# Patient Record
Sex: Female | Born: 1970 | Race: Black or African American | Hispanic: No | Marital: Married | Smoking: Former smoker
Health system: Southern US, Community
[De-identification: ages and names within clinical notes are randomized; demographics above are authoritative.]

## PROBLEM LIST (undated history)

## (undated) DIAGNOSIS — R609 Edema, unspecified: Secondary | ICD-10-CM

## (undated) DIAGNOSIS — I10 Essential (primary) hypertension: Secondary | ICD-10-CM

## (undated) DIAGNOSIS — J40 Bronchitis, not specified as acute or chronic: Secondary | ICD-10-CM

## (undated) DIAGNOSIS — J45909 Unspecified asthma, uncomplicated: Secondary | ICD-10-CM

## (undated) HISTORY — PX: ABDOMINAL HYSTERECTOMY: SHX81

## (undated) HISTORY — PX: OTHER SURGICAL HISTORY: SHX169

## (undated) HISTORY — DX: Bronchitis, not specified as acute or chronic: J40

## (undated) HISTORY — DX: Essential (primary) hypertension: I10

---

## 2004-02-12 ENCOUNTER — Emergency Department: Payer: Self-pay | Admitting: Emergency Medicine

## 2004-10-27 ENCOUNTER — Emergency Department: Payer: Self-pay | Admitting: Emergency Medicine

## 2005-04-02 ENCOUNTER — Emergency Department: Payer: Self-pay | Admitting: Emergency Medicine

## 2006-09-05 ENCOUNTER — Emergency Department: Payer: Self-pay

## 2007-06-21 ENCOUNTER — Emergency Department: Payer: Self-pay | Admitting: Emergency Medicine

## 2007-11-01 ENCOUNTER — Emergency Department: Payer: Self-pay | Admitting: Emergency Medicine

## 2007-12-31 ENCOUNTER — Emergency Department: Payer: Self-pay | Admitting: Internal Medicine

## 2008-01-02 ENCOUNTER — Ambulatory Visit: Payer: Self-pay

## 2008-07-26 ENCOUNTER — Emergency Department: Payer: Self-pay | Admitting: Emergency Medicine

## 2008-09-30 ENCOUNTER — Emergency Department: Payer: Self-pay | Admitting: Emergency Medicine

## 2008-10-09 ENCOUNTER — Emergency Department: Payer: Self-pay | Admitting: Emergency Medicine

## 2008-12-16 ENCOUNTER — Emergency Department: Payer: Self-pay | Admitting: Unknown Physician Specialty

## 2008-12-23 ENCOUNTER — Emergency Department: Payer: Self-pay | Admitting: Emergency Medicine

## 2009-03-28 ENCOUNTER — Observation Stay: Payer: Self-pay | Admitting: Internal Medicine

## 2009-04-29 ENCOUNTER — Ambulatory Visit: Payer: Self-pay | Admitting: Internal Medicine

## 2009-10-27 ENCOUNTER — Emergency Department: Payer: Self-pay | Admitting: Unknown Physician Specialty

## 2009-11-02 ENCOUNTER — Emergency Department: Payer: Self-pay | Admitting: Emergency Medicine

## 2010-04-04 ENCOUNTER — Emergency Department: Payer: Self-pay | Admitting: Emergency Medicine

## 2010-08-22 ENCOUNTER — Emergency Department: Payer: Self-pay | Admitting: Unknown Physician Specialty

## 2011-04-19 ENCOUNTER — Emergency Department: Payer: Self-pay | Admitting: Emergency Medicine

## 2011-04-19 LAB — CBC
HCT: 40 % (ref 35.0–47.0)
HGB: 13.2 g/dL (ref 12.0–16.0)
MCH: 31.6 pg (ref 26.0–34.0)
MCHC: 32.9 g/dL (ref 32.0–36.0)
MCV: 96 fL (ref 80–100)
RDW: 14.6 % — ABNORMAL HIGH (ref 11.5–14.5)

## 2011-04-19 LAB — COMPREHENSIVE METABOLIC PANEL
BUN: 11 mg/dL (ref 7–18)
Chloride: 103 mmol/L (ref 98–107)
EGFR (African American): >60
EGFR (Non-African Amer.): >60
Osmolality: 278 (ref 275–301)
SGOT(AST): 21 U/L (ref 15–37)
SGPT (ALT): 21 U/L
Sodium: 140 mmol/L (ref 136–145)
Total Protein: 7.5 g/dL (ref 6.4–8.2)

## 2011-10-30 ENCOUNTER — Emergency Department: Payer: Self-pay | Admitting: Emergency Medicine

## 2012-07-21 IMAGING — CR DG CHEST 2V
1 series · 2 of 2 positions shown · non-contrast
Comparison: none

REASON FOR EXAM: chest pain
COMMENTS:   LMP: N/A

PROCEDURE:     DXR - DXR CHEST PA (OR AP) AND LATERAL  - April 19, 2011 [DATE]
RESULT:     Comparison: None

[Series 1: w chest pa · 0.14mm/px · 2 of 2 slices shown]
[im 1/2]
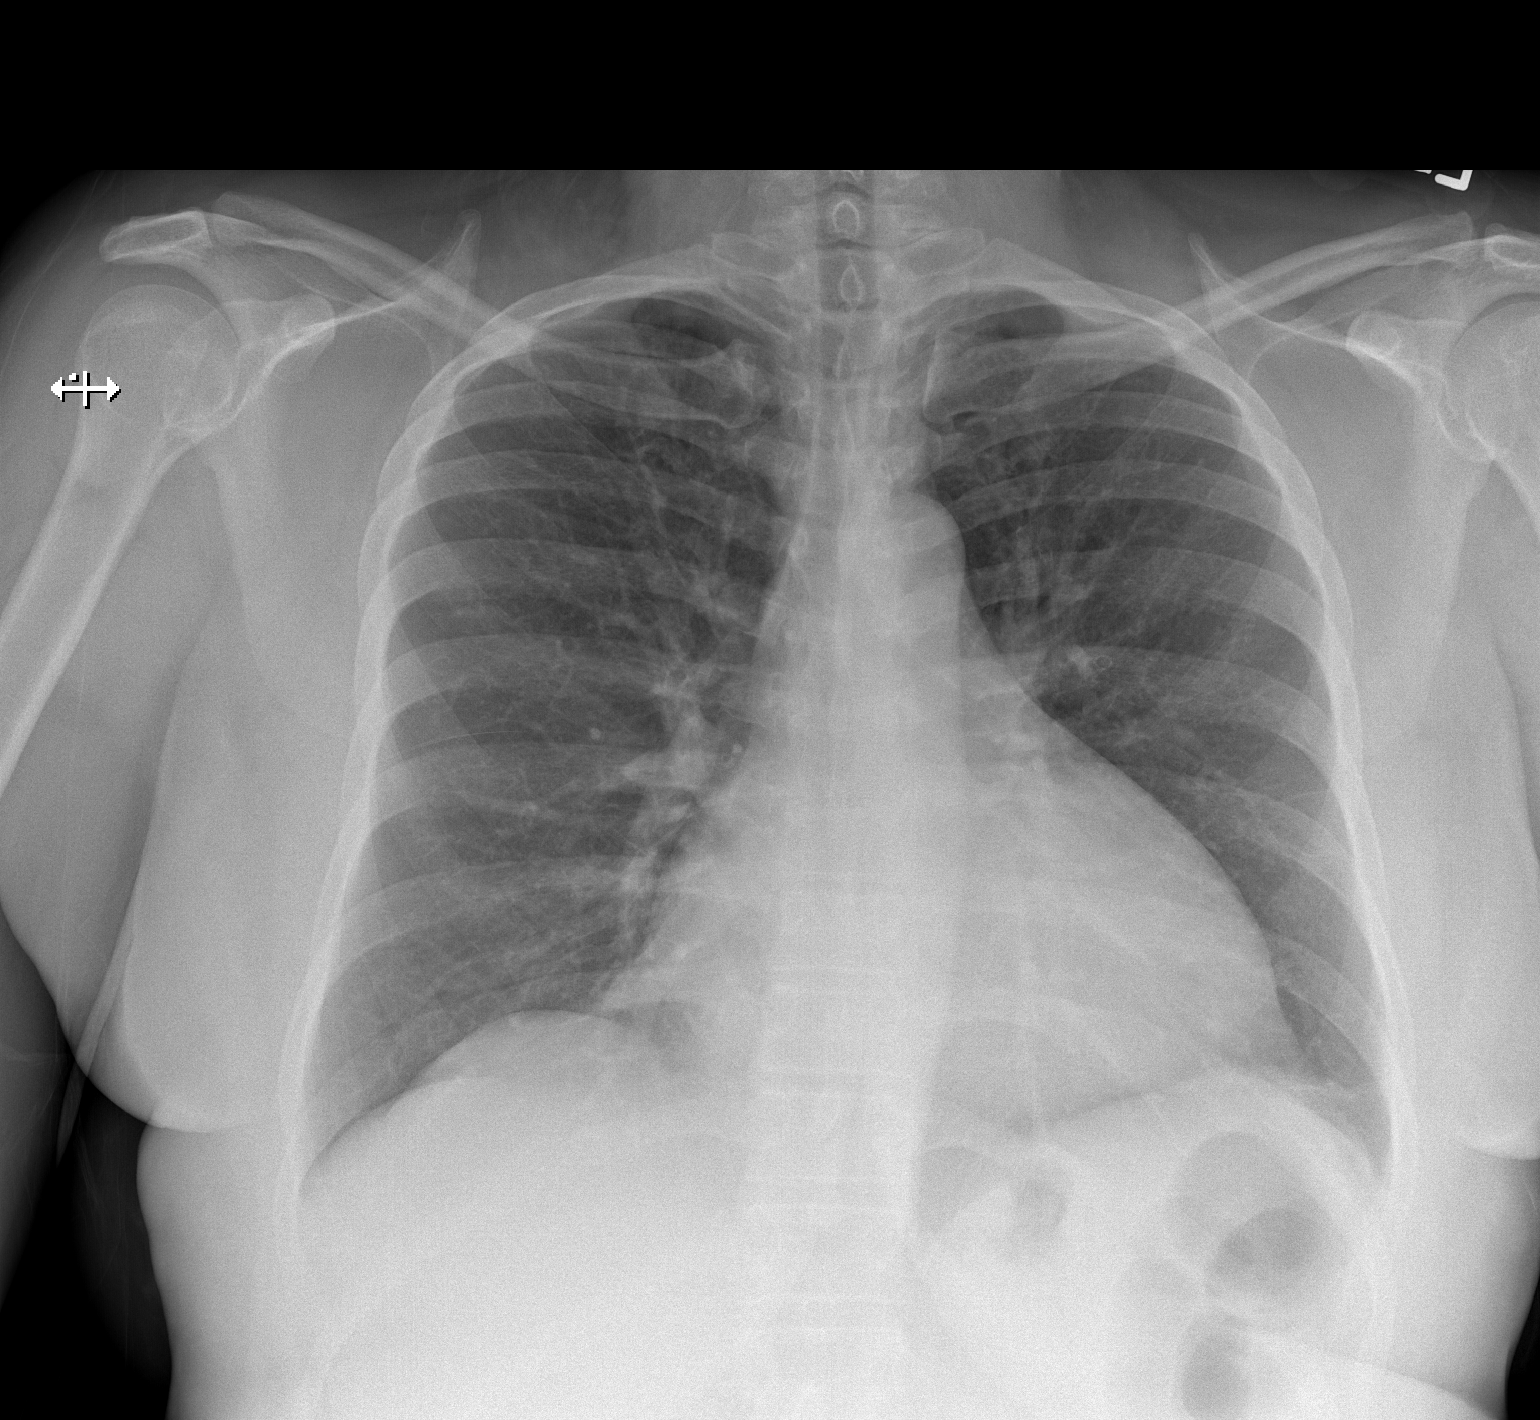
[im 2/2]
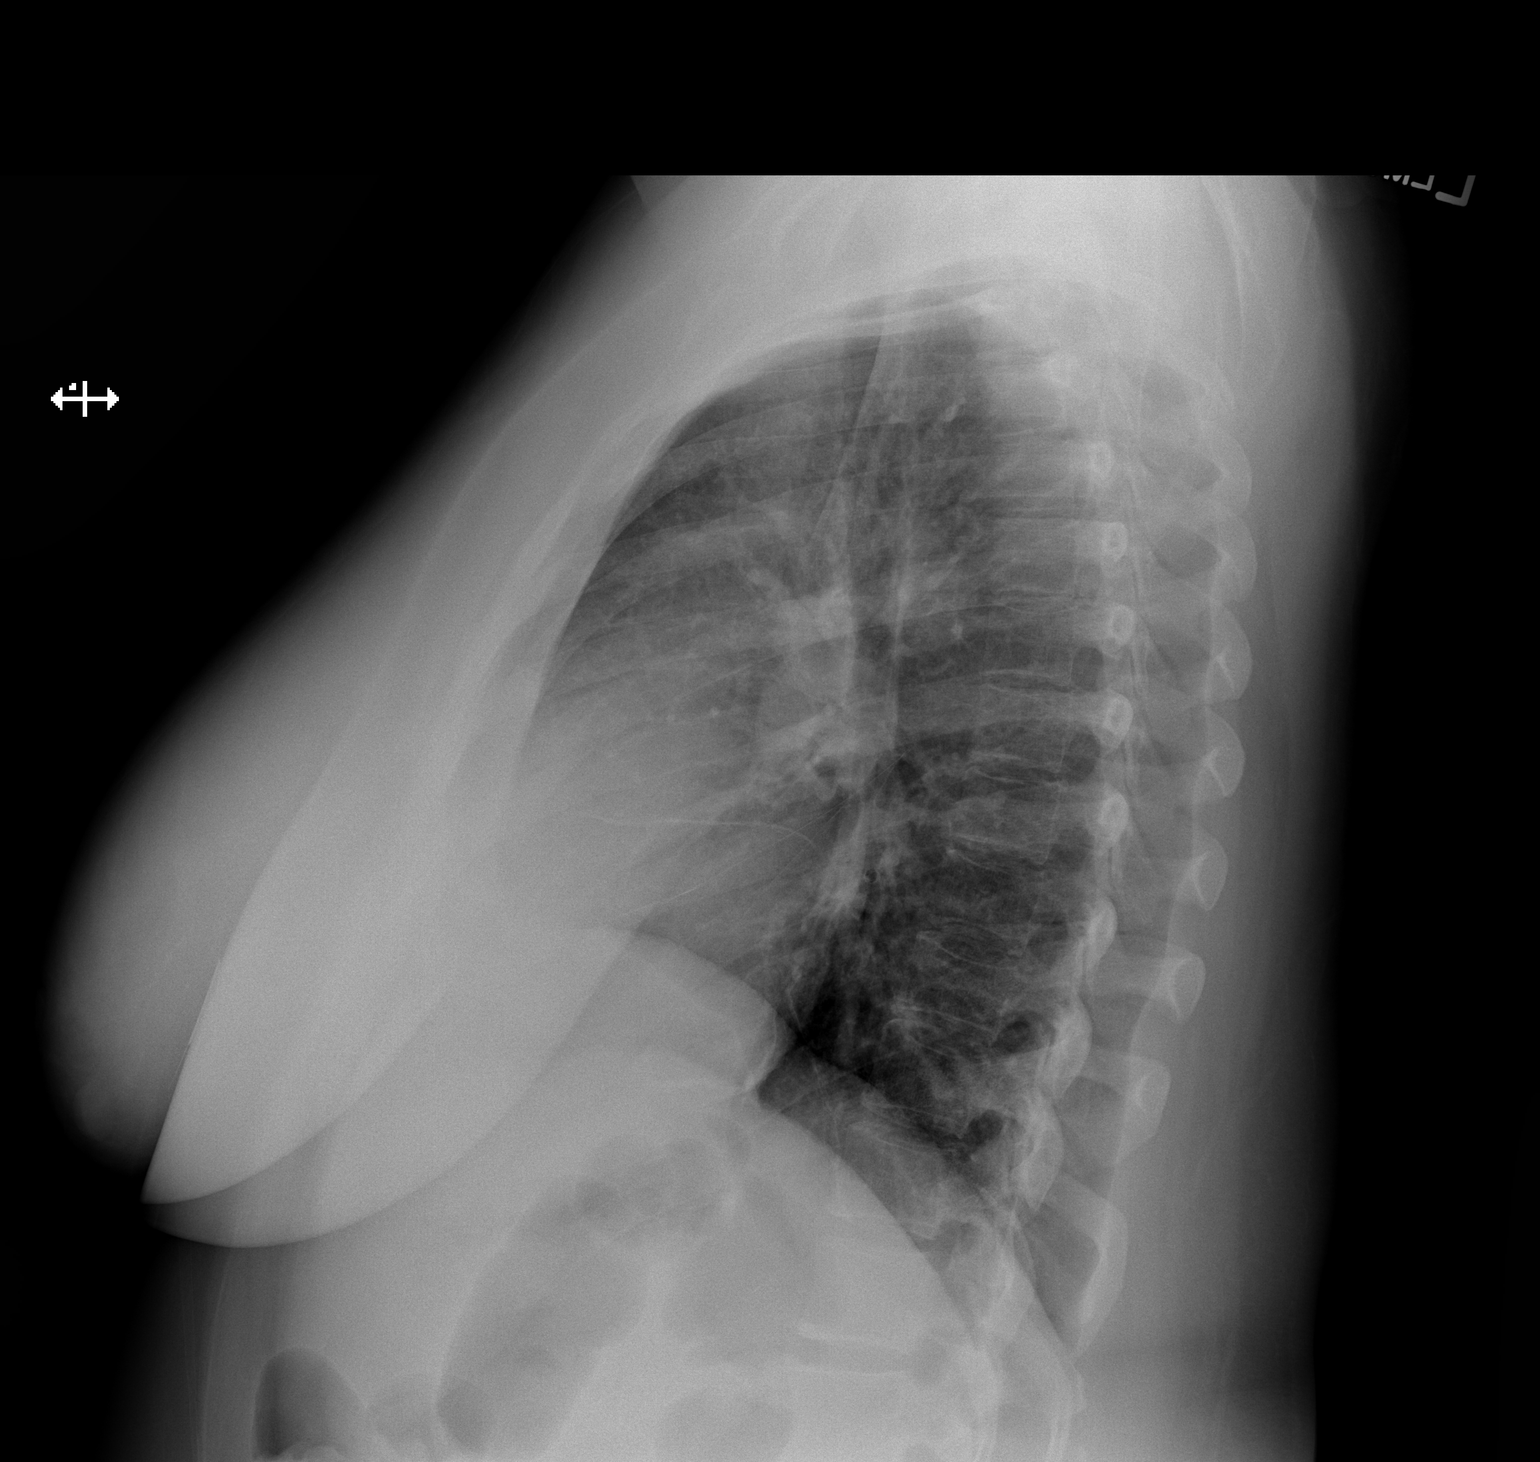

[2 of 2 positions shown; findings below may reference images not displayed]

FINDINGS: PA and lateral chest radiographs are provided.  There is no focal
parenchymal opacity, pleural effusion, or pneumothorax. The heart and
mediastinum are unremarkable.  The osseous structures are unremarkable.
IMPRESSION: No acute disease of the che[REDACTED]

## 2012-09-29 ENCOUNTER — Emergency Department: Payer: Self-pay | Admitting: Emergency Medicine

## 2013-04-16 ENCOUNTER — Other Ambulatory Visit (HOSPITAL_COMMUNITY): Payer: Self-pay | Admitting: Neurosurgery

## 2013-04-16 DIAGNOSIS — M48061 Spinal stenosis, lumbar region without neurogenic claudication: Secondary | ICD-10-CM

## 2013-04-16 DIAGNOSIS — IMO0002 Reserved for concepts with insufficient information to code with codable children: Secondary | ICD-10-CM

## 2013-04-16 DIAGNOSIS — M5126 Other intervertebral disc displacement, lumbar region: Secondary | ICD-10-CM

## 2013-04-17 ENCOUNTER — Other Ambulatory Visit: Payer: Self-pay | Admitting: Radiology

## 2013-04-24 ENCOUNTER — Ambulatory Visit (HOSPITAL_COMMUNITY)
Admission: RE | Admit: 2013-04-24 | Discharge: 2013-04-24 | Disposition: A | Discharge status: Home / Self Care | Payer: Worker's Compensation | Source: Ambulatory Visit | Attending: Neurosurgery | Admitting: Neurosurgery

## 2013-04-24 ENCOUNTER — Other Ambulatory Visit (HOSPITAL_COMMUNITY): Payer: Self-pay | Admitting: Neurosurgery

## 2013-04-24 ENCOUNTER — Ambulatory Visit (HOSPITAL_COMMUNITY)
Admission: RE | Admit: 2013-04-24 | Discharge: 2013-04-24 | Disposition: A | Discharge status: Home / Self Care | Payer: Self-pay | Source: Ambulatory Visit | Attending: Neurosurgery | Admitting: Neurosurgery

## 2013-04-24 DIAGNOSIS — M5126 Other intervertebral disc displacement, lumbar region: Secondary | ICD-10-CM

## 2013-04-24 DIAGNOSIS — IMO0002 Reserved for concepts with insufficient information to code with codable children: Secondary | ICD-10-CM

## 2013-04-24 DIAGNOSIS — M545 Low back pain, unspecified: Secondary | ICD-10-CM | POA: Insufficient documentation

## 2013-04-24 DIAGNOSIS — M5030 Other cervical disc degeneration, unspecified cervical region: Secondary | ICD-10-CM | POA: Insufficient documentation

## 2013-04-24 DIAGNOSIS — M5137 Other intervertebral disc degeneration, lumbosacral region: Secondary | ICD-10-CM | POA: Insufficient documentation

## 2013-04-24 DIAGNOSIS — M48061 Spinal stenosis, lumbar region without neurogenic claudication: Secondary | ICD-10-CM

## 2013-04-24 DIAGNOSIS — M51379 Other intervertebral disc degeneration, lumbosacral region without mention of lumbar back pain or lower extremity pain: Secondary | ICD-10-CM | POA: Insufficient documentation

## 2013-04-24 MED ORDER — IOHEXOL 180 MG/ML  SOLN
20.0000 mL | Freq: Once | INTRAMUSCULAR | Status: AC | PRN
  Administered 2013-04-24: 20 mL via INTRATHECAL

## 2013-04-24 MED ORDER — OXYCODONE-ACETAMINOPHEN 5-325 MG PO TABS
ORAL_TABLET | ORAL | Status: AC
  Filled 2013-04-24: qty 2

## 2013-04-24 MED ORDER — OXYCODONE-ACETAMINOPHEN 5-325 MG PO TABS
2.0000 | ORAL_TABLET | Freq: Four times a day (QID) | ORAL | Status: AC | PRN
  Administered 2013-04-24: 2 via ORAL

## 2013-04-24 MED ORDER — DIAZEPAM 5 MG PO TABS
10.0000 mg | ORAL_TABLET | Freq: Once | ORAL | Status: AC
  Administered 2013-04-24: 10 mg via ORAL
  Filled 2013-04-24: qty 2

## 2013-04-24 NOTE — Progress Notes (Signed)
Went over discharge instructions with pt. She verbalized understanding. Pt. Taken to car via w/c. Pt. Had no c/o. Female adult going to drive pt. Home.

## 2013-04-24 NOTE — Discharge Instructions (Signed)
Myelography Myelography is an X-ray test that uses a special dye to look at your spine or neck. This test is usually done to look for:  Spinal cord injury.  Disk ruptures.  Fluid-filled pockets of tissue (cysts) on your spinal cord or nerve roots.  Tumors on your spinal cord or nerve roots. BEFORE THE PROCEDURE Arrange for someone to drive you home after the test.  PROCEDURE  You will lie on your stomach during the procedure.  Medicine may be given to you to help you relax.  A numbing medicine will be applied to area that they will be injecting you with a needle.  A needle will be inserted between two of your back bones.  A special machine will be used to help your doctor guide the needle into the sac that surrounds your spinal cord and nerves. A special dye will be injected into this sac.  The table you lie on may be moved around to make sure the dye moves all around your spinal cord and nerves.  Pictures the area will be taken by X-ray or CT. AFTER THE PROCEDURE  You will be taken to a recovery area.  You will lie flat with your head in a raised position. This is to prevent a severe headache. Document Released: 10/27/2007 Document Revised: 01/04/2012 Document Reviewed: 10/12/2011 [employer] Patient Information 2014 [employer].  

## 2013-04-24 NOTE — Progress Notes (Signed)
Pt requested that I ask Dr Karin GoldenShogry if her discharge could be at 1330 instead of 1400, due to transportation needs. Dr Karin GoldenShogry gave permission for pt to do so. Michele RockersBelinda Tillman, RN discharged pt.

## 2013-05-31 HISTORY — PX: BACK SURGERY: SHX140

## 2014-02-27 ENCOUNTER — Emergency Department: Payer: Self-pay | Admitting: Emergency Medicine

## 2014-03-02 ENCOUNTER — Inpatient Hospital Stay: Payer: Self-pay | Admitting: Internal Medicine

## 2014-03-02 DIAGNOSIS — J209 Acute bronchitis, unspecified: Secondary | ICD-10-CM

## 2014-03-02 LAB — BASIC METABOLIC PANEL
Anion Gap: 12 (ref 7–16)
BUN: 14 mg/dL (ref 7–18)
CALCIUM: 8.7 mg/dL (ref 8.5–10.1)
Chloride: 104 mmol/L (ref 98–107)
Co2: 21 mmol/L (ref 21–32)
Creatinine: 1.13 mg/dL (ref 0.60–1.30)
EGFR (Non-African Amer.): 55 — ABNORMAL LOW
Glucose: 117 mg/dL — ABNORMAL HIGH (ref 65–99)
Osmolality: 275 (ref 275–301)
Potassium: 3.2 mmol/L — ABNORMAL LOW (ref 3.5–5.1)
SODIUM: 137 mmol/L (ref 136–145)

## 2014-03-02 LAB — CBC
HCT: 40.5 % (ref 35.0–47.0)
HGB: 13.3 g/dL (ref 12.0–16.0)
MCH: 31.1 pg (ref 26.0–34.0)
MCHC: 32.8 g/dL (ref 32.0–36.0)
MCV: 95 fL (ref 80–100)
PLATELETS: 216 10*3/uL (ref 150–440)
RBC: 4.27 10*6/uL (ref 3.80–5.20)
RDW: 13.7 % (ref 11.5–14.5)
WBC: 14 10*3/uL — ABNORMAL HIGH (ref 3.6–11.0)

## 2014-03-02 LAB — TROPONIN I: TROPONIN-I: 0.04 ng/mL

## 2014-03-03 DIAGNOSIS — J209 Acute bronchitis, unspecified: Secondary | ICD-10-CM

## 2014-03-03 LAB — BASIC METABOLIC PANEL
Anion Gap: 8 (ref 7–16)
BUN: 14 mg/dL (ref 7–18)
CHLORIDE: 105 mmol/L (ref 98–107)
CO2: 24 mmol/L (ref 21–32)
Calcium, Total: 8.1 mg/dL — ABNORMAL LOW (ref 8.5–10.1)
Creatinine: 0.91 mg/dL (ref 0.60–1.30)
EGFR (African American): >60
EGFR (Non-African Amer.): >60
Glucose: 144 mg/dL — ABNORMAL HIGH (ref 65–99)
Osmolality: 277 (ref 275–301)
Potassium: 4.4 mmol/L (ref 3.5–5.1)
SODIUM: 137 mmol/L (ref 136–145)

## 2014-03-03 LAB — CBC WITH DIFFERENTIAL/PLATELET
BASOS ABS: 0.1 10*3/uL (ref 0.0–0.1)
Basophil %: 0.5 %
Eosinophil #: 0 10*3/uL (ref 0.0–0.7)
Eosinophil %: 0 %
HCT: 38.4 % (ref 35.0–47.0)
HGB: 12.1 g/dL (ref 12.0–16.0)
LYMPHS ABS: 0.8 10*3/uL — AB (ref 1.0–3.6)
LYMPHS PCT: 5.9 %
MCH: 30.5 pg (ref 26.0–34.0)
MCHC: 31.6 g/dL — AB (ref 32.0–36.0)
MCV: 97 fL (ref 80–100)
MONOS PCT: 3.3 %
Monocyte #: 0.5 x10 3/mm (ref 0.2–0.9)
Neutrophil #: 12.9 10*3/uL — ABNORMAL HIGH (ref 1.4–6.5)
Neutrophil %: 90.3 %
Platelet: 223 10*3/uL (ref 150–440)
RBC: 3.98 10*6/uL (ref 3.80–5.20)
RDW: 14.1 % (ref 11.5–14.5)
WBC: 14.3 10*3/uL — AB (ref 3.6–11.0)

## 2014-03-13 ENCOUNTER — Telehealth: Payer: Self-pay | Admitting: Internal Medicine

## 2014-03-13 ENCOUNTER — Emergency Department: Payer: Self-pay | Admitting: Emergency Medicine

## 2014-03-13 MED ORDER — AMOXICILLIN-POT CLAVULANATE 875-125 MG PO TABS: 1.0000 | ORAL_TABLET | Freq: Two times a day (BID) | ORAL | Status: DC

## 2014-03-13 MED ORDER — PHENYLEPH-PROMETHAZINE-COD 5-6.25-10 MG/5ML PO SYRP: 5.0000 mL | ORAL_SOLUTION | Freq: Four times a day (QID) | ORAL | Status: DC | PRN

## 2014-03-13 NOTE — Telephone Encounter (Addendum)
D/C summary received and will give to Dr Maple HudsonYoung (doc of the day) to review and advise  Called and verified pt has NKDA

## 2014-03-13 NOTE — Telephone Encounter (Signed)
Per CDY- call in augmentin 875 mg 1 bid # 14 and phenergan with codeine syrup # 200 ml 1 tsp every 6 hours prn  Pt aware of the above and ok with this plan  I have called in these meds and also refilled her albuterol inhaler to use PRN per her request

## 2014-03-13 NOTE — Telephone Encounter (Signed)
Spoke with the pt  She states that she was [facility]  She thought she had HFU with VM today, but the appt is not until 03/20/14  Pt states that she has finished her ABX and "steroids" as of 03/12/14  She c/o still having a cough- prod with moderate brown sputum  She feels that her SOB is not improving since d/c and has been having tightness in her chest for the past 2 days  I advised will send msg to doc on call since Dr Dema SeverinMungal is off this pm  [facility] fax over the d/c summary and consult note from pulm

## 2014-03-20 ENCOUNTER — Ambulatory Visit (INDEPENDENT_AMBULATORY_CARE_PROVIDER_SITE_OTHER): Payer: Self-pay | Admitting: Internal Medicine

## 2014-03-20 ENCOUNTER — Encounter: Payer: Self-pay | Admitting: Internal Medicine

## 2014-03-20 ENCOUNTER — Inpatient Hospital Stay: Payer: Self-pay | Admitting: Internal Medicine

## 2014-03-20 ENCOUNTER — Ambulatory Visit: Payer: Self-pay | Admitting: Internal Medicine

## 2014-03-20 VITALS — BP 148/70 | HR 62 | Temp 97.9°F | Ht 65.0 in | Wt 251.4 lb

## 2014-03-20 DIAGNOSIS — R059 Cough, unspecified: Secondary | ICD-10-CM | POA: Insufficient documentation

## 2014-03-20 DIAGNOSIS — R05 Cough: Secondary | ICD-10-CM

## 2014-03-20 DIAGNOSIS — R0600 Dyspnea, unspecified: Secondary | ICD-10-CM

## 2014-03-20 DIAGNOSIS — Z716 Tobacco abuse counseling: Secondary | ICD-10-CM | POA: Insufficient documentation

## 2014-03-20 MED ORDER — ALBUTEROL SULFATE HFA 108 (90 BASE) MCG/ACT IN AERS: 2.0000 | INHALATION_SPRAY | RESPIRATORY_TRACT | Status: DC | PRN

## 2014-03-20 NOTE — Assessment & Plan Note (Signed)
Tobacco Cessation - Counseling regarding benefits of smoking cessation strategies was provided for more than 12 min. - Educated that at this time smoking- cessation represents the single most important step that patient can take to enhance the length and quality of live. - Educated patient regarding alternatives of behavior interventions, pharmacotherapy including NRT and non-nicotine therapy such, and combinations of both. - Patient at this time: will try to quit her own, she is currently down to 3 cigarettes per day previously smoked half pack per day for 30 years
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
## 2014-03-20 NOTE — Assessment & Plan Note (Signed)
Multifactorial: Possible COPD/obstructive disease/obesity/continued tobacco abuse/post infectious  Currently with mild cough and mild sputum production, recently completed a course of antibiotics and steroids, has a benign physical examination at this time is not warm it for the use of inhaled corticosteroids, antibiotics.  Plan: -Pulmonary function testing, 6 minute walk testing, supportive care,Tobacco Cessation

## 2014-03-20 NOTE — Assessment & Plan Note (Signed)
Differential diagnosis includes: COPD, continued tobacco abuse, obesity, deconditioning, obstructive/restrictive disease.  I believe that her dyspnea is a multifactorial process in combination with tobacco abuse, possible COPD, obesity and deconditioning.  Plan: - Pulmonary function testing, 6 minute walk testing. -Tobacco cessation counseling given today, scratch at -As needed albuterol for shortness of breath/wheezing 2 puffs every 4 hours use spacer with inhaler.

## 2014-03-20 NOTE — Patient Instructions (Signed)
Follow up with Dr. Dema SeverinMungal in 2 weeks. 1. Pulmonary Function Testing and 6 min walk test prior to next visit 2. Stop smoking - this will help your symptoms 3. Albuterol as need, 2 puff every 4 hours for wheezing\shortness of breath.

## 2014-04-03 ENCOUNTER — Other Ambulatory Visit: Payer: Self-pay | Admitting: Internal Medicine

## 2014-04-03 DIAGNOSIS — R05 Cough: Secondary | ICD-10-CM

## 2014-04-03 DIAGNOSIS — R059 Cough, unspecified: Secondary | ICD-10-CM

## 2014-04-10 ENCOUNTER — Ambulatory Visit (INDEPENDENT_AMBULATORY_CARE_PROVIDER_SITE_OTHER): Payer: Self-pay | Admitting: Internal Medicine

## 2014-04-10 ENCOUNTER — Encounter: Payer: Self-pay | Admitting: Internal Medicine

## 2014-04-10 VITALS — BP 130/90 | HR 87 | Ht 65.0 in | Wt 248.0 lb

## 2014-04-10 DIAGNOSIS — R059 Cough, unspecified: Secondary | ICD-10-CM

## 2014-04-10 DIAGNOSIS — R0600 Dyspnea, unspecified: Secondary | ICD-10-CM

## 2014-04-10 DIAGNOSIS — R05 Cough: Secondary | ICD-10-CM

## 2014-04-10 DIAGNOSIS — Z716 Tobacco abuse counseling: Secondary | ICD-10-CM

## 2014-04-10 LAB — PULMONARY FUNCTION TEST
DL/VA % pred: 76 %
DL/VA: 3.78 ml/min/mmHg/L
DLCO UNC: 26.11 ml/min/mmHg
DLCO unc % pred: 101 %
FEF 25-75 Post: 0.74 L/sec
FEF 25-75 Pre: 2.02 L/sec
FEF2575-%CHANGE-POST: -63 %
FEF2575-%PRED-POST: 27 %
FEF2575-%PRED-PRE: 75 %
FEV1-%Change-Post: -42 %
FEV1-%Pred-Post: 45 %
FEV1-%Pred-Pre: 79 %
FEV1-POST: 1.14 L
FEV1-Pre: 1.99 L
FEV1FVC-%Change-Post: -39 %
FEV1FVC-%PRED-PRE: 97 %
FEV6-%CHANGE-POST: -6 %
FEV6-%PRED-PRE: 82 %
FEV6-%Pred-Post: 76 %
FEV6-PRE: 2.49 L
FEV6-Post: 2.32 L
FEV6FVC-%PRED-POST: 103 %
FEV6FVC-%Pred-Pre: 103 %
FVC-%Change-Post: -5 %
FVC-%PRED-POST: 75 %
FVC-%PRED-PRE: 80 %
FVC-Post: 2.35 L
FVC-Pre: 2.49 L
POST FEV1/FVC RATIO: 49 %
PRE FEV1/FVC RATIO: 80 %
Post FEV6/FVC ratio: 100 %
Pre FEV6/FVC Ratio: 100 %
RV % pred: 87 %
RV: 1.54 L
TLC % PRED: 80 %
TLC: 4.19 L

## 2014-04-10 NOTE — Assessment & Plan Note (Signed)
Tobacco Cessation - Counseling regarding benefits of smoking cessation strategies was provided for more than 12 min. - Educated that at this time smoking- cessation represents the single most important step that patient can take to enhance the length and quality of live. - Educated patient regarding alternatives of behavior interventions, pharmacotherapy including NRT and non-nicotine therapy such, and combinations of both. - Patient at this time: will try to quit her own, she is currently down to 2 cigarettes per day previously smoked half pack per day for 30 years

## 2014-04-10 NOTE — Assessment & Plan Note (Signed)
Multifactorial: continued tobacco abuse/post infectious  Currently with mild cough and mild sputum production, recently completed a course of antibiotics and steroids, has a benign physical examination at this time is not warm it for the use of inhaled corticosteroids, antibiotics.  Plan: - Qvar trial, see plan for dyspnea - supportive care,Tobacco Cessation

## 2014-04-10 NOTE — Progress Notes (Signed)
PFT performed today. 

## 2014-04-10 NOTE — Progress Notes (Signed)
SMW performed today. 
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
## 2014-04-10 NOTE — Assessment & Plan Note (Signed)
Differential diagnosis includes: asthma, continued tobacco abuse, obesity, deconditioning,   I believe that her dyspnea is a multifactorial process in combination with tobacco abuse, obesity and deconditioning. Patient had pulmonary function testing done today that showed a FEV1/FVC 80, FEV1 79, RV 87 TLC 80 ERV 21. Essentially normal pulmonary function testing with severely decreased ERV was likely secondary to abdominal obesity. She still endorses cough and shortness of breath does not really relieve with albuterol, the symptoms mostly occur with exertion. Given her clinical history I believe she may have the beginnings of asthma.  Plan: - trial of inhaled corticosteroid for one month- patient advised and educated on administration and proper technique of using the inhaler. Pulmicort one puff twice a day. -Tobacco cessation counseling given today,  -As needed albuterol for shortness of breath/wheezing 2 puffs every 4 hours use spacer with inhaler.

## 2014-04-10 NOTE — Patient Instructions (Signed)
Follow up with Dr. Dema SeverinMungal in 1 month - we will give you a trial of Qvar for suspected asthma  - take 1 puff twice a day, rinse and gargle after each use.  - continue with health diet and exercise. - please stop smoking

## 2014-04-15 ENCOUNTER — Emergency Department: Payer: Self-pay | Admitting: Emergency Medicine

## 2014-04-28 ENCOUNTER — Telehealth: Payer: Self-pay | Admitting: Internal Medicine

## 2014-04-28 NOTE — Telephone Encounter (Signed)
See my recent note, under dyspnea, the results\interpertation of the PFTs are there.

## 2014-04-28 NOTE — Telephone Encounter (Signed)
Pt calling for results.Andrea Kim ° °

## 2014-04-28 NOTE — Telephone Encounter (Signed)
lmtcb for pt.  

## 2014-04-28 NOTE — Telephone Encounter (Signed)
Called and spoke to pt. Pt is requesting the results of her PFT as she is working on a disability claim and is needing Dr. Courtney ParisMungal's interpretation of recent PFT's.  Dr. Dema SeverinMungal please advise.

## 2014-04-29 NOTE — Telephone Encounter (Signed)
Per VS OV note:  Patient had pulmonary function testing done today that showed a FEV1/FVC 80, FEV1 79, RV 87 TLC 80 ERV 21. Essentially normal pulmonary function testing with severely decreased ERV was likely secondary to abdominal obesity. --- Pt was made aware of her PFT results. Nothing further was needed.

## 2014-05-12 ENCOUNTER — Other Ambulatory Visit: Payer: Self-pay | Admitting: *Deleted

## 2014-05-14 ENCOUNTER — Ambulatory Visit: Payer: Self-pay | Admitting: Internal Medicine

## 2014-05-15 ENCOUNTER — Encounter: Payer: Self-pay | Admitting: *Deleted
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
## 2014-06-17 ENCOUNTER — Encounter: Payer: Self-pay | Admitting: *Deleted

## 2014-06-17 ENCOUNTER — Ambulatory Visit: Payer: Self-pay | Admitting: Internal Medicine

## 2014-07-17 ENCOUNTER — Ambulatory Visit (INDEPENDENT_AMBULATORY_CARE_PROVIDER_SITE_OTHER): Payer: Self-pay | Admitting: Internal Medicine

## 2014-07-17 ENCOUNTER — Encounter: Payer: Self-pay | Admitting: Internal Medicine

## 2014-07-17 VITALS — BP 118/70 | HR 95 | Temp 98.2°F | Ht 65.0 in | Wt 252.0 lb

## 2014-07-17 DIAGNOSIS — R05 Cough: Secondary | ICD-10-CM

## 2014-07-17 DIAGNOSIS — J45909 Unspecified asthma, uncomplicated: Secondary | ICD-10-CM | POA: Insufficient documentation

## 2014-07-17 DIAGNOSIS — R059 Cough, unspecified: Secondary | ICD-10-CM

## 2014-07-17 DIAGNOSIS — R0600 Dyspnea, unspecified: Secondary | ICD-10-CM

## 2014-07-17 DIAGNOSIS — Z716 Tobacco abuse counseling: Secondary | ICD-10-CM

## 2014-07-17 DIAGNOSIS — J4530 Mild persistent asthma, uncomplicated: Secondary | ICD-10-CM

## 2014-07-17 MED ORDER — RANITIDINE HCL 300 MG PO TABS: 300.0000 mg | ORAL_TABLET | Freq: Every day | ORAL | Status: DC

## 2014-07-17 MED ORDER — BECLOMETHASONE DIPROPIONATE 80 MCG/ACT IN AERS: 1.0000 | INHALATION_SPRAY | Freq: Two times a day (BID) | RESPIRATORY_TRACT | Status: DC

## 2014-07-17 NOTE — Assessment & Plan Note (Addendum)
Multifactorial: continued tobacco abuse/acid reflux  Currently, cough is resolved.  Plan: - Qvar, see plan for dyspnea - supportive care,Tobacco Cessation

## 2014-07-17 NOTE — Assessment & Plan Note (Addendum)
Differential diagnosis includes: asthma, continued tobacco abuse, obesity, deconditioning, Dyspnea is improving well overall.  I believe that her dyspnea is a multifactorial process in combination with tobacco abuse, obesity and deconditioning. Patient had pulmonary function testing done today that showed a FEV1/FVC 80, FEV1 79, RV 87 TLC 80 ERV 21. Essentially normal pulmonary function testing with severely decreased ERV was likely secondary to abdominal obesity. She still endorses cough and shortness of breath does not really relieve with albuterol, the symptoms mostly occur with exertion. Given her clinical history, will continue to treat as asthma. She is still uncontrolled having nighttime shortness of breath and mild cough, when laying flat and use of rescue inhaler.   Plan: -Continue with Qvar 1 puff twice a day, gargle and rinse after each use -Nighttime symptoms concerning for acid reflux that maybe triggering asthma. Prescription for ranitidine 300 mg 1 tab in the morning, given to patient. -Diet, weight loss, exercise. -Avoid tobacco.
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
## 2014-07-17 NOTE — Assessment & Plan Note (Signed)
Patient with mild persistent asthma. She is currently still smoking June the symptoms of nighttime cough for 5 times a week with use of rescue inhaler, she is uncontrolled at this time. I believe that acid refluxes causing some of her symptoms for worsening asthma symptoms at night. We discussed starting ranitidine today, which she is in agreement with. Due to the patient not having insurance, will write for ranitidine off the for all [employer] list.  Plan: -Continue with Qvar 1 puff twice a day, gargle and rinse after each use. -Ranitidine 300 mg: 1 tablet daily, with full glass of water on an empty stomach. -Avoid tobacco -Diet, weight loss, and healthy daily exercise.

## 2014-07-17 NOTE — Assessment & Plan Note (Signed)
Tobacco Cessation - Counseling regarding benefits of smoking cessation strategies was provided for more than 12 min. - Educated that at this time smoking- cessation represents the single most important step that patient can take to enhance the length and quality of live. - Educated patient regarding alternatives of behavior interventions, pharmacotherapy including NRT and non-nicotine therapy such, and combinations of both. - Patient at this time:trying to quit on her own.     

## 2014-07-17 NOTE — Patient Instructions (Addendum)
Follow up with Dr. Dema Severin in 3 months. - cont with Qvar 1 puff, morning and evening - gargle and rinse after each use - please stop smoking - Ranitidine 300mg  - 1 tab daily, rx for 3 month supply. Please take in the mornings with a glass of water. This prescription is given for acid reflux symptoms which could be triggering your asthma at night.  - diet, weight loss and exercise daily.

## 2014-09-16 ENCOUNTER — Encounter: Payer: Self-pay | Admitting: Emergency Medicine

## 2014-09-16 ENCOUNTER — Emergency Department
Admission: EM | Admit: 2014-09-16 | Discharge: 2014-09-16 | Disposition: A | Discharge status: Home / Self Care | Payer: Self-pay | Attending: Emergency Medicine | Admitting: Emergency Medicine

## 2014-09-16 ENCOUNTER — Emergency Department: Payer: Self-pay

## 2014-09-16 DIAGNOSIS — Z720 Tobacco use: Secondary | ICD-10-CM | POA: Insufficient documentation

## 2014-09-16 DIAGNOSIS — Z7951 Long term (current) use of inhaled steroids: Secondary | ICD-10-CM | POA: Insufficient documentation

## 2014-09-16 DIAGNOSIS — R1084 Generalized abdominal pain: Secondary | ICD-10-CM

## 2014-09-16 DIAGNOSIS — I10 Essential (primary) hypertension: Secondary | ICD-10-CM | POA: Insufficient documentation

## 2014-09-16 DIAGNOSIS — Z79899 Other long term (current) drug therapy: Secondary | ICD-10-CM | POA: Insufficient documentation

## 2014-09-16 DIAGNOSIS — K5732 Diverticulitis of large intestine without perforation or abscess without bleeding: Secondary | ICD-10-CM

## 2014-09-16 HISTORY — DX: Unspecified asthma, uncomplicated: J45.909

## 2014-09-16 LAB — CBC WITH DIFFERENTIAL/PLATELET
BASOS ABS: 0.1 10*3/uL (ref 0–0.1)
BASOS PCT: 1 %
Eosinophils Absolute: 0.1 10*3/uL (ref 0–0.7)
Eosinophils Relative: 2 %
HEMATOCRIT: 41.9 % (ref 35.0–47.0)
HEMOGLOBIN: 13.5 g/dL (ref 12.0–16.0)
LYMPHS PCT: 22 %
Lymphs Abs: 1.4 10*3/uL (ref 1.0–3.6)
MCH: 30.1 pg (ref 26.0–34.0)
MCHC: 32.3 g/dL (ref 32.0–36.0)
MCV: 93.3 fL (ref 80.0–100.0)
Monocytes Absolute: 0.4 10*3/uL (ref 0.2–0.9)
Monocytes Relative: 5 %
NEUTROS PCT: 70 %
Neutro Abs: 4.8 10*3/uL (ref 1.4–6.5)
Platelets: 198 10*3/uL (ref 150–440)
RBC: 4.49 MIL/uL (ref 3.80–5.20)
RDW: 14.3 % (ref 11.5–14.5)
WBC: 6.7 10*3/uL (ref 3.6–11.0)

## 2014-09-16 LAB — COMPREHENSIVE METABOLIC PANEL
ALBUMIN: 3.8 g/dL (ref 3.5–5.0)
ALK PHOS: 83 U/L (ref 38–126)
ALT: 12 U/L — AB (ref 14–54)
AST: 14 U/L — AB (ref 15–41)
Anion gap: 7 (ref 5–15)
BILIRUBIN TOTAL: 0.6 mg/dL (ref 0.3–1.2)
BUN: 10 mg/dL (ref 6–20)
CO2: 26 mmol/L (ref 22–32)
CREATININE: 1 mg/dL (ref 0.44–1.00)
Calcium: 8.6 mg/dL — ABNORMAL LOW (ref 8.9–10.3)
Chloride: 106 mmol/L (ref 101–111)
GFR calc Af Amer: >60 mL/min (ref >60)
GFR calc non Af Amer: >60 mL/min (ref >60)
Glucose, Bld: 92 mg/dL (ref 65–99)
POTASSIUM: 3.7 mmol/L (ref 3.5–5.1)
Sodium: 139 mmol/L (ref 135–145)
TOTAL PROTEIN: 6.9 g/dL (ref 6.5–8.1)

## 2014-09-16 LAB — URINALYSIS COMPLETE WITH MICROSCOPIC (ARMC ONLY)
Bilirubin Urine: NEGATIVE
GLUCOSE, UA: NEGATIVE mg/dL
KETONES UR: NEGATIVE mg/dL
NITRITE: POSITIVE — AB
PROTEIN: NEGATIVE mg/dL
SPECIFIC GRAVITY, URINE: 1.014 (ref 1.005–1.030)
pH: 5 (ref 5.0–8.0)

## 2014-09-16 LAB — LIPASE, BLOOD: Lipase: 14 U/L — ABNORMAL LOW (ref 22–51)

## 2014-09-16 MED ORDER — HYDROCODONE-ACETAMINOPHEN 5-325 MG PO TABS: 1.0000 | ORAL_TABLET | ORAL | Status: DC | PRN

## 2014-09-16 MED ORDER — IOHEXOL 240 MG/ML SOLN
25.0000 mL | Freq: Once | INTRAMUSCULAR | Status: AC | PRN
  Administered 2014-09-16: 25 mL via ORAL
  Filled 2014-09-16: qty 25

## 2014-09-16 MED ORDER — IOHEXOL 300 MG/ML  SOLN
100.0000 mL | Freq: Once | INTRAMUSCULAR | Status: AC | PRN
  Administered 2014-09-16: 100 mL via INTRAVENOUS
  Filled 2014-09-16: qty 100

## 2014-09-16 MED ORDER — CIPROFLOXACIN HCL 500 MG PO TABS
500.0000 mg | ORAL_TABLET | Freq: Two times a day (BID) | ORAL | Status: AC
Start: 2014-09-16 — End: 2014-09-26

## 2014-09-16 MED ORDER — SODIUM CHLORIDE 0.9 % IV SOLN
1000.0000 mL | Freq: Once | INTRAVENOUS | Status: AC
  Administered 2014-09-16: 1000 mL via INTRAVENOUS

## 2014-09-16 MED ORDER — METRONIDAZOLE 500 MG PO TABS: 500.0000 mg | ORAL_TABLET | Freq: Three times a day (TID) | ORAL | Status: AC

## 2014-09-16 MED ORDER — ONDANSETRON HCL 4 MG/2ML IJ SOLN
4.0000 mg | Freq: Once | INTRAMUSCULAR | Status: AC
  Administered 2014-09-16: 4 mg via INTRAVENOUS
  Filled 2014-09-16: qty 2

## 2014-09-16 MED ORDER — MORPHINE SULFATE (PF) 4 MG/ML IV SOLN
4.0000 mg | Freq: Once | INTRAVENOUS | Status: AC
  Administered 2014-09-16: 4 mg via INTRAVENOUS
  Filled 2014-09-16: qty 1

## 2014-09-16 NOTE — ED Notes (Signed)
Reports abd pain all over, n/v x 2 days

## 2014-09-16 NOTE — ED Provider Notes (Signed)
[facility] Emergency Department Provider Note  ____________________________________________  Time seen: 1 pm  I have reviewed the triage vital signs and the nursing notes.   HISTORY  Chief Complaint Abdominal Pain    HPI Lynel Rola Lennon is a 44 y.o. female who p/w c/o of abdominal cramping, nausea/vomiting x 1 day. She reports pain is moderate to severe. Denies fevers/chills. No sick contacts. No recent travel. No vaginal discharge.     Past Medical History  Diagnosis Date  . Hypertension   . Bronchitis     Patient Active Problem List   Diagnosis Date Noted  . Asthma, chronic 07/17/2014  . Cough 03/20/2014  . Dyspnea 03/20/2014  . Tobacco abuse counseling 03/20/2014    Past Surgical History  Procedure Laterality Date  . Back surgery  05/2013  . Etopic pregnancy      Current Outpatient Rx  Name  Route  Sig  Dispense  Refill  . albuterol (PROVENTIL HFA;VENTOLIN HFA) 108 (90 BASE) MCG/ACT inhaler   Inhalation   Inhale 2 puffs into the lungs every 4 (four) hours as needed for wheezing or shortness of breath.   1 Inhaler   2   . beclomethasone (QVAR) 80 MCG/ACT inhaler   Inhalation   Inhale 1 puff into the lungs 2 (two) times daily.   1 Inhaler   0   . ranitidine (ZANTAC) 300 MG tablet   Oral   Take 1 tablet (300 mg total) by mouth at bedtime.   90 tablet   1     Allergies Review of patient's allergies indicates no known allergies.  Family History  Problem Relation Age of Onset  . Prostate cancer Father     Social History Social History  Substance Use Topics  . Smoking status: Current Every Day Smoker -- 0.30 packs/day for 30 years    Types: Cigarettes  . Smokeless tobacco: Never Used  . Alcohol Use: No    Review of Systems  Constitutional: Negative for fever. Eyes: Negative for visual changes. ENT: Negative for sore throat Cardiovascular: Negative for chest pain. Respiratory: Negative for shortness of  breath. Gastrointestinal: +abd pain, +n/v/d. Genitourinary: Negative for dysuria. Musculoskeletal: Negative for back pain. Skin: Negative for rash. Neurological: Negative for headaches  Psychiatric:no anxiety    ____________________________________________   PHYSICAL EXAM:  VITAL SIGNS: ED Triage Vitals  Enc Vitals Group     BP 09/16/14 1203 148/91 mmHg     Pulse Rate 09/16/14 1203 92     Resp 09/16/14 1203 18     Temp 09/16/14 1203 98.3 F (36.8 C)     Temp src --      SpO2 09/16/14 1203 98 %     Weight 09/16/14 1203 253 lb (114.76 kg)     Height 09/16/14 1203 5\' 5"  (1.651 m)     Head Cir --      Peak Flow --      Pain Score 09/16/14 1204 9     Pain Loc --      Pain Edu? --      Excl. in GC? --     Constitutional: Alert and oriented. Well appearing and in no distress. Eyes: Conjunctivae are normal.  ENT   Head: Normocephalic and atraumatic.   Mouth/Throat: Mucous membranes are moist. Cardiovascular: Normal rate, regular rhythm. Normal and symmetric distal pulses are present in all extremities. No murmurs, rubs, or gallops. Respiratory: Normal respiratory effort without tachypnea nor retractions. Breath sounds are clear and equal bilaterally.  Gastrointestinal:Mild ttp to rlq.  No distention. There is no CVA tenderness. Genitourinary: deferred Musculoskeletal: Nontender with normal range of motion in all extremities. No lower extremity tenderness nor edema. Neurologic:  Normal speech and language. No gross focal neurologic deficits are appreciated. Skin:  Skin is warm, dry and intact. No rash noted. Psychiatric: Mood and affect are normal. Patient exhibits appropriate insight and judgment.  ____________________________________________    LABS (pertinent positives/negatives)  Labs Reviewed  CBC WITH DIFFERENTIAL/PLATELET  COMPREHENSIVE METABOLIC PANEL  LIPASE, BLOOD  URINALYSIS COMPLETEWITH MICROSCOPIC ([facility] ONLY)     ____________________________________________   EKG  none  ____________________________________________    RADIOLOGY I have personally reviewed any xrays that were ordered on this patient: CT pending  ____________________________________________   PROCEDURES  Procedure(s) performed: none  Critical Care performed: none  ____________________________________________   INITIAL IMPRESSION / ASSESSMENT AND PLAN / ED COURSE  Pertinent labs & imaging results that were available during my care of the patient were reviewed by me and considered in my medical decision making (see chart for details).  Patient with N/v/d and abdominal pain x 1 day. Significant ttp in the RLQ. Labs pending. Morphine 4 mg and Zofran 4 mg IV and NS given. Patient will be signed out to Dr. Lenard Lance to f/u CT scan and re-evaluate patient.  ____________________________________________   FINAL CLINICAL IMPRESSION(S) / ED DIAGNOSES  Final diagnoses:  Generalized abdominal pain     Jene Every, MD 09/16/14 1355

## 2014-09-16 NOTE — Discharge Instructions (Signed)
Diverticulitis °Diverticulitis is when small pockets that have formed in your colon (large intestine) become infected or swollen. °HOME CARE °· Follow your doctor's instructions. °· Follow a special diet if told by your doctor. °· When you feel better, your doctor may tell you to change your diet. You may be told to eat a lot of fiber. Fruits and vegetables are good sources of fiber. Fiber makes it easier to poop (have bowel movements). °· Take supplements or probiotics as told by your doctor. °· Only take medicines as told by your doctor. °· Keep all follow-up visits with your doctor. °GET HELP IF: °· Your pain does not get better. °· You have a hard time eating food. °· You are not pooping like normal. °GET HELP RIGHT AWAY IF: °· Your pain gets worse. °· Your problems do not get better. °· Your problems suddenly get worse. °· You have a fever. °· You keep throwing up (vomiting). °· You have bloody or black, tarry poop (stool). °MAKE SURE YOU:  °· Understand these instructions. °· Will watch your condition. °· Will get help right away if you are not doing well or get worse. °Document Released: 07/06/2007 Document Revised: 01/22/2013 Document Reviewed: 12/12/2012 °[employer]® Patient Information ©2015 [employer]. This information is not intended to replace advice given to you by your health care provider. Make sure you discuss any questions you have with your health care provider. ° °

## 2014-09-16 NOTE — ED Provider Notes (Signed)
-----------------------------------------   4:24 PM on 09/16/2014 -----------------------------------------  CT consistent with diverticulitis/panniculitis. We will start on antibiotics as well as pain medication as needed. Patient is follow up with GI medicine in approximately 1-2 weeks for reevaluation. I also discussed following up with her primary care doctor in the next 2-3 days for recheck. Incidentally the patient also had a 4.5 cm left ovarian cyst. I discussed this with the patient and the need to follow up with OB/GYN for repeat ultrasound in 6-8 weeks. The patient is agreeable. Discussed strict return precautions with the patient, we'll discharge the patient home with antibiotics, Norco as needed, GI and PCP follow-up.  Minna Antis, MD 09/16/14 [id number]

## 2014-10-02 ENCOUNTER — Emergency Department
Admission: EM | Admit: 2014-10-02 | Discharge: 2014-10-02 | Disposition: A | Discharge status: Home / Self Care | Payer: Self-pay | Attending: Emergency Medicine | Admitting: Emergency Medicine

## 2014-10-02 ENCOUNTER — Encounter: Payer: Self-pay | Admitting: Emergency Medicine

## 2014-10-02 DIAGNOSIS — Z720 Tobacco use: Secondary | ICD-10-CM | POA: Insufficient documentation

## 2014-10-02 DIAGNOSIS — F322 Major depressive disorder, single episode, severe without psychotic features: Secondary | ICD-10-CM

## 2014-10-02 DIAGNOSIS — G8929 Other chronic pain: Secondary | ICD-10-CM | POA: Insufficient documentation

## 2014-10-02 DIAGNOSIS — I10 Essential (primary) hypertension: Secondary | ICD-10-CM | POA: Insufficient documentation

## 2014-10-02 DIAGNOSIS — R440 Auditory hallucinations: Secondary | ICD-10-CM

## 2014-10-02 DIAGNOSIS — Z7951 Long term (current) use of inhaled steroids: Secondary | ICD-10-CM | POA: Insufficient documentation

## 2014-10-02 DIAGNOSIS — Z79899 Other long term (current) drug therapy: Secondary | ICD-10-CM | POA: Insufficient documentation

## 2014-10-02 DIAGNOSIS — M546 Pain in thoracic spine: Secondary | ICD-10-CM | POA: Insufficient documentation

## 2014-10-02 LAB — CBC
HEMATOCRIT: 40.5 % (ref 35.0–47.0)
HEMOGLOBIN: 13.3 g/dL (ref 12.0–16.0)
MCH: 30.2 pg (ref 26.0–34.0)
MCHC: 32.8 g/dL (ref 32.0–36.0)
MCV: 91.8 fL (ref 80.0–100.0)
Platelets: 238 10*3/uL (ref 150–440)
RBC: 4.41 MIL/uL (ref 3.80–5.20)
RDW: 14.2 % (ref 11.5–14.5)
WBC: 6.6 10*3/uL (ref 3.6–11.0)

## 2014-10-02 LAB — URINE DRUG SCREEN, QUALITATIVE (ARMC ONLY)
Amphetamines, Ur Screen: NOT DETECTED
BARBITURATES, UR SCREEN: NOT DETECTED
Benzodiazepine, Ur Scrn: NOT DETECTED
COCAINE METABOLITE, UR ~~LOC~~: NOT DETECTED
Cannabinoid 50 Ng, Ur ~~LOC~~: NOT DETECTED
MDMA (Ecstasy)Ur Screen: NOT DETECTED
METHADONE SCREEN, URINE: NOT DETECTED
OPIATE, UR SCREEN: NOT DETECTED
PHENCYCLIDINE (PCP) UR S: NOT DETECTED
Tricyclic, Ur Screen: POSITIVE — AB

## 2014-10-02 LAB — SALICYLATE LEVEL: Salicylate Lvl: <4 mg/dL (ref 2.8–30.0)

## 2014-10-02 LAB — COMPREHENSIVE METABOLIC PANEL
ALBUMIN: 3.8 g/dL (ref 3.5–5.0)
ALK PHOS: 73 U/L (ref 38–126)
ALT: 22 U/L (ref 14–54)
ANION GAP: 7 (ref 5–15)
AST: 27 U/L (ref 15–41)
BILIRUBIN TOTAL: 0.7 mg/dL (ref 0.3–1.2)
BUN: 12 mg/dL (ref 6–20)
CALCIUM: 8.5 mg/dL — AB (ref 8.9–10.3)
CO2: 23 mmol/L (ref 22–32)
CREATININE: 1.09 mg/dL — AB (ref 0.44–1.00)
Chloride: 107 mmol/L (ref 101–111)
GFR calc Af Amer: >60 mL/min (ref >60)
GFR calc non Af Amer: 60 mL/min — ABNORMAL LOW (ref >60)
GLUCOSE: 133 mg/dL — AB (ref 65–99)
Potassium: 3.3 mmol/L — ABNORMAL LOW (ref 3.5–5.1)
SODIUM: 137 mmol/L (ref 135–145)
TOTAL PROTEIN: 6.5 g/dL (ref 6.5–8.1)

## 2014-10-02 LAB — URINALYSIS COMPLETE WITH MICROSCOPIC (ARMC ONLY)
Bilirubin Urine: NEGATIVE
Glucose, UA: NEGATIVE mg/dL
Ketones, ur: NEGATIVE mg/dL
Nitrite: POSITIVE — AB
PH: 5 (ref 5.0–8.0)
Protein, ur: NEGATIVE mg/dL
Specific Gravity, Urine: 1.024 (ref 1.005–1.030)

## 2014-10-02 LAB — ACETAMINOPHEN LEVEL

## 2014-10-02 LAB — ETHANOL: Alcohol, Ethyl (B): <5 mg/dL (ref <5)

## 2014-10-02 MED ORDER — MIRTAZAPINE 30 MG PO TABS: 30.0000 mg | ORAL_TABLET | Freq: Every day | ORAL | Status: DC

## 2014-10-02 MED ORDER — IBUPROFEN 600 MG PO TABS: 600.0000 mg | ORAL_TABLET | ORAL | Status: DC

## 2014-10-02 NOTE — ED Notes (Signed)
Pt dressed out by this RN and Tobi Bastos, Char[employer] fundraiser. Pt's belongings locked up in [facility].
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
## 2014-10-02 NOTE — ED Notes (Signed)
Patient with no complaints at this time. Respirations even and unlabored. Skin warm/dry. Discharge instructions reviewed with patient at this time. Patient given opportunity to voice concerns/ask questions. Patient discharged at this time and left Emergency Department with steady gait.   
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
## 2014-10-02 NOTE — ED Notes (Signed)
BEHAVIORAL HEALTH ROUNDING Patient sleeping: No. Patient alert and oriented: yes Behavior appropriate: Yes.  ; If no, describe: n/a Nutrition and fluids offered: Yes  Toileting and hygiene offered: Yes  Sitter present: yes Law enforcement present: Yes   ENVIRONMENTAL ASSESSMENT Potentially harmful objects out of patient reach: Yes.   Personal belongings secured: Yes.   Patient dressed in hospital provided attire only: Yes.   Plastic bags out of patient reach: Yes.   Patient care equipment (cords, cables, call bells, lines, and drains) shortened, removed, or accounted for: Yes.   Equipment and supplies removed from bottom of stretcher: Yes.   Potentially toxic materials out of patient reach: Yes.   Sharps container removed or out of patient reach: Yes.   

## 2014-10-02 NOTE — ED Notes (Signed)
BEHAVIORAL HEALTH ROUNDING Patient sleeping: No. Patient alert and oriented: yes Behavior appropriate: Yes.  ; If no, describe: n/a Nutrition and fluids offered: Yes  Toileting and hygiene offered: Yes  Sitter present: yes Law enforcement present: Yes  

## 2014-10-02 NOTE — ED Notes (Addendum)
Pt reports SI x3 days, told by PCP to come over here. Pt denies hx of the same. Pt denies plan. Pt reports hearing "voices in my head telling me to hurt myself for months". Pt with hx of PTSD.

## 2014-10-02 NOTE — ED Notes (Signed)
Patient arrives from home with SI with a plan to  "walk out into traffic". Patient states + Auditory hallucinations, negative, Violent,  and self deprecating in nature. Voices in her head tell her "to kill" herself because she "doesnt deserve to live". Patient unable to distinguish if voices are real or not. Patient presents as cooperative and calm at this time.   Patient is  Seen by Elnita Maxwell at the [facility] facility and is normally on seroquel however has not been seen in a month and has not taken her medication in 2 weeks due to financial constraints.

## 2014-10-02 NOTE — ED Provider Notes (Signed)
[facility] Emergency Department Provider Note  ____________________________________________  Time seen: Approximately 2:46 PM  I have reviewed the triage vital signs and the nursing notes.   HISTORY  Chief Complaint Suicidal    HPI Andrea Kim is a 44 y.o. female reports a history of bipolar disorder followed by psychiatry as an outpatient, and also a history of diverticulitis couple weeks ago which is improved. She reports that for the last 3 or 4 days she's been feeling suicidal but has not had an active plan. She also reports that she's been hearing voices for the last several days. She had a history of psychiatric disease and has been treated for this in the past, but states that she has run out of her Seroquel about 2 weeks ago which seems to make her symptoms worse. She is under the care of an outpatient psychiatrist but reports that last few weeks things have been worsening, and in particular the last 3 days she is truly felt as if she is at risk of committing suicide.  She does have some aching in her right back which she says is all chronic in nature. She denies nausea vomiting or diarrhea, and symptoms of previous infection have improved since August 16. No fevers. Denies pregnancy.  She is married. She does not work. Her husband does work.   Past Medical History  Diagnosis Date  . Hypertension   . Bronchitis   . Asthma     Patient Active Problem List   Diagnosis Date Noted  . Asthma, chronic 07/17/2014  . Cough 03/20/2014  . Dyspnea 03/20/2014  . Tobacco abuse counseling 03/20/2014    Past Surgical History  Procedure Laterality Date  . Back surgery  05/2013  . Etopic pregnancy    . Abdominal hysterectomy      Current Outpatient Rx  Name  Route  Sig  Dispense  Refill  . albuterol (PROVENTIL HFA;VENTOLIN HFA) 108 (90 BASE) MCG/ACT inhaler   Inhalation   Inhale 2 puffs into the lungs every 4 (four) hours as needed for  wheezing or shortness of breath.   1 Inhaler   2   . beclomethasone (QVAR) 80 MCG/ACT inhaler   Inhalation   Inhale 1 puff into the lungs 2 (two) times daily.   1 Inhaler   0   . HYDROcodone-acetaminophen (NORCO/VICODIN) 5-325 MG per tablet   Oral   Take 1 tablet by mouth every 4 (four) hours as needed for moderate pain.   20 tablet   0   . ranitidine (ZANTAC) 300 MG tablet   Oral   Take 1 tablet (300 mg total) by mouth at bedtime.   90 tablet   1     Allergies Review of patient's allergies indicates no known allergies.  Family History  Problem Relation Age of Onset  . Prostate cancer Father     Social History Social History  Substance Use Topics  . Smoking status: Current Every Day Smoker -- 0.30 packs/day for 30 years    Types: Cigarettes  . Smokeless tobacco: Never Used  . Alcohol Use: No    Review of Systems Constitutional: No fever/chills Eyes: No visual changes. ENT: No sore throat. Cardiovascular: Denies chest pain. Respiratory: Denies shortness of breath. Gastrointestinal: No abdominal pain.  No nausea, no vomiting.  No diarrhea.  No constipation. Genitourinary: Negative for dysuria. Musculoskeletal: Negative for back pain except for some chronic discomfort in the right mid back. No numbness or tingling. Skin: Negative for rash.  Neurological: Negative for headaches, focal weakness or numbness.  10-point ROS otherwise negative.  ____________________________________________   PHYSICAL EXAM:  VITAL SIGNS: ED Triage Vitals  Enc Vitals Group     BP 10/02/14 1334 150/104 mmHg     Pulse Rate 10/02/14 1334 115     Resp 10/02/14 1334 16     Temp 10/02/14 1334 98.1 F (36.7 C)     Temp Source 10/02/14 1334 Oral     SpO2 10/02/14 1334 95 %     Weight 10/02/14 1334 243 lb (110.224 kg)     Height 10/02/14 1334 5\' 5"  (1.651 m)     Head Cir --      Peak Flow --      Pain Score 10/02/14 1335 8     Pain Loc --      Pain Edu? --      Excl. in GC? --      Constitutional: Alert and oriented. Well appearing and in no acute distress. Eyes: Conjunctivae are normal. PERRL. EOMI. Head: Atraumatic. Nose: No congestion/rhinnorhea. Mouth/Throat: Mucous membranes are moist.  Oropharynx non-erythematous. Neck: No stridor.   Cardiovascular: Normal rate, regular rhythm. Grossly normal heart sounds.  Good peripheral circulation. Respiratory: Normal respiratory effort.  No retractions. Lungs CTAB. Gastrointestinal: Soft and nontender. No distention. No abdominal bruits. No CVA tenderness. Musculoskeletal: No lower extremity tenderness nor edema.  No joint effusions. Neurologic:  Normal speech and language. No gross focal neurologic deficits are appreciated. No gait instability. Skin:  Skin is warm, dry and intact. No rash noted. Psychiatric: Mood and affect are sad and depressed. Speech and behavior are somewhat sad and withdrawn.  ____________________________________________   LABS (all labs ordered are listed, but only abnormal results are displayed)  Labs Reviewed  COMPREHENSIVE METABOLIC PANEL - Abnormal; Notable for the following:    Potassium 3.3 (*)    Glucose, Bld 133 (*)    Creatinine, Ser 1.09 (*)    Calcium 8.5 (*)    GFR calc non Af Amer 60 (*)    All other components within normal limits  ACETAMINOPHEN LEVEL - Abnormal; Notable for the following:    Acetaminophen (Tylenol), Serum <10 (*)    All other components within normal limits  ETHANOL  SALICYLATE LEVEL  CBC  URINE DRUG SCREEN, QUALITATIVE ([facility] ONLY)  URINALYSIS COMPLETEWITH MICROSCOPIC ([facility] ONLY)   ____________________________________________  EKG   ____________________________________________  RADIOLOGY   ____________________________________________   PROCEDURES  Procedure(s) performed: None  Critical Care performed: No  ____________________________________________   INITIAL IMPRESSION / ASSESSMENT AND PLAN / ED COURSE  Pertinent labs & imaging  results that were available during my care of the patient were reviewed by me and considered in my medical decision making (see chart for details).  Patient presents with thoughts of suicide. Notably she is been off of her Seroquel, and reports increasing symptoms of depression and now hearing auditory hallucinations for the last 3 days. She was recently treated for diverticulitis, but appears his symptoms improved. She has of chronic ongoing back pain but this is not due to be acute based on the symptomatology that she tells me of.  I will place the patient under commitment for safety and I ordered a psychiatric consultation.  ----------------------------------------- 3:09 PM on 10/02/2014 -----------------------------------------  Patient is medically clear for psychiatric referral, pending urinalysis due to the patient's reported right back pain which I do not believe his urinary tract infection but we will exclude. Care and ongoing disposition assigned to Dr. Scotty Court  at this time. ____________________________________________   FINAL CLINICAL IMPRESSION(S) / ED DIAGNOSES  Final diagnoses:  Auditory hallucination      Sharyn Creamer, MD 10/02/14 1510

## 2014-10-02 NOTE — ED Provider Notes (Signed)
-----------------------------------------   7:06 PM on 10/02/2014 -----------------------------------------   Blood pressure 150/104, pulse 115, temperature 98.1 F (36.7 C), temperature source Oral, resp. rate 16, height  (1.651 m), weight 243 lb (110.224 kg), SpO2 95 %.  The patient had no acute events since last update.  Calm and cooperative at this time.  Patient remains medically stable in the ED. I discussed the case with psychiatry Dr. Toni Amend at 7:00 PM who will prescribe Remeron and states the patient is psychiatrically stable for discharge. No evidence of threat to self or others, able to care for self. Will follow up with [facility] where she is an established patient.  Final diagnoses:  Auditory hallucination     Sharman Cheek, MD 10/02/14 [phone]

## 2014-10-03 ENCOUNTER — Telehealth: Payer: Self-pay | Admitting: Emergency Medicine

## 2014-10-03 NOTE — ED Notes (Signed)
patietn called asking for cheaper rx than mirtazapine.  Says it is not on 4 dollar list as she was told.  The [facility] 4 dollar list has been revised as of about 2 weeks ago.  She called after 12, so medication management was closed. i explained good rx, but patient still wanted to see if dr clapacs can give her something from 4 dollar list.  i called and spoke with dr clapacs, and he said he did not want to give another medication.  i called the pateint back and explained that md wanted her to have this specific med.  I told her the best thing would be to go to [facility] and pay the 16 dollare with goodrx coupon.  She agrees to do that.

## 2014-11-20 ENCOUNTER — Ambulatory Visit: Payer: Self-pay | Admitting: Internal Medicine

## 2014-11-20 ENCOUNTER — Encounter: Payer: Self-pay | Admitting: *Deleted

## 2014-11-25 ENCOUNTER — Emergency Department: Payer: Self-pay

## 2014-11-25 ENCOUNTER — Encounter: Payer: Self-pay | Admitting: Emergency Medicine

## 2014-11-25 ENCOUNTER — Emergency Department
Admission: EM | Admit: 2014-11-25 | Discharge: 2014-11-26 | Disposition: A | Discharge status: Home / Self Care | Payer: Self-pay | Attending: Emergency Medicine | Admitting: Emergency Medicine

## 2014-11-25 DIAGNOSIS — Z79899 Other long term (current) drug therapy: Secondary | ICD-10-CM | POA: Insufficient documentation

## 2014-11-25 DIAGNOSIS — X58XXXA Exposure to other specified factors, initial encounter: Secondary | ICD-10-CM | POA: Insufficient documentation

## 2014-11-25 DIAGNOSIS — I10 Essential (primary) hypertension: Secondary | ICD-10-CM | POA: Insufficient documentation

## 2014-11-25 DIAGNOSIS — Z7951 Long term (current) use of inhaled steroids: Secondary | ICD-10-CM | POA: Insufficient documentation

## 2014-11-25 DIAGNOSIS — M25462 Effusion, left knee: Secondary | ICD-10-CM | POA: Insufficient documentation

## 2014-11-25 DIAGNOSIS — S8392XA Sprain of unspecified site of left knee, initial encounter: Secondary | ICD-10-CM | POA: Insufficient documentation

## 2014-11-25 DIAGNOSIS — Y9289 Other specified places as the place of occurrence of the external cause: Secondary | ICD-10-CM | POA: Insufficient documentation

## 2014-11-25 DIAGNOSIS — Y9389 Activity, other specified: Secondary | ICD-10-CM | POA: Insufficient documentation

## 2014-11-25 DIAGNOSIS — S86812A Strain of other muscle(s) and tendon(s) at lower leg level, left leg, initial encounter: Secondary | ICD-10-CM | POA: Insufficient documentation

## 2014-11-25 DIAGNOSIS — Z720 Tobacco use: Secondary | ICD-10-CM | POA: Insufficient documentation

## 2014-11-25 DIAGNOSIS — Y998 Other external cause status: Secondary | ICD-10-CM | POA: Insufficient documentation

## 2014-11-25 NOTE — ED Notes (Signed)
Pt presents to ED with left knee pain. Pt states she rolled over in her bed and heard a "pop". Pt states she noticed pain immediately which has increased since onset. Pt reports swelling; no obvious deformity.

## 2014-11-25 NOTE — ED Provider Notes (Signed)
[facility] Emergency Department Provider Note ____________________________________________  Time seen: 2327  I have reviewed the triage vital signs and the nursing notes.  HISTORY  Chief Complaint  Knee Pain  HPI Andrea Kim is a 44 y.o. female reports to the ED for evaluation of left knee pain that happened on Saturday, 4 days prior to arrival. She describes rolling over in the bed when she heard a "pop". She denies a history of knee pain prior to this onset. She has noted increased pain with attempts to ambulate, noting that she has to walk more on her toes. She localizes the pain to the medial and posterior aspect of the knee and calf. She rates her pain at 10/10 in triage, and describes the pain as throbbing.  Past Medical History  Diagnosis Date  . Hypertension   . Bronchitis   . Asthma     Patient Active Problem List   Diagnosis Date Noted  . Depression, major, single episode, severe (HCC) 10/02/2014  . Asthma, chronic 07/17/2014  . Cough 03/20/2014  . Dyspnea 03/20/2014  . Tobacco abuse counseling 03/20/2014    Past Surgical History  Procedure Laterality Date  . Back surgery  05/2013  . Etopic pregnancy    . Abdominal hysterectomy      Current Outpatient Rx  Name  Route  Sig  Dispense  Refill  . albuterol (PROVENTIL HFA;VENTOLIN HFA) 108 (90 BASE) MCG/ACT inhaler   Inhalation   Inhale 2 puffs into the lungs every 4 (four) hours as needed for wheezing or shortness of breath.   1 Inhaler   2   . beclomethasone (QVAR) 80 MCG/ACT inhaler   Inhalation   Inhale 1 puff into the lungs 2 (two) times daily.   1 Inhaler   0   . cyclobenzaprine (FLEXERIL) 5 MG tablet   Oral   Take 1 tablet (5 mg total) by mouth every 8 (eight) hours as needed for muscle spasms.   12 tablet   0   . HYDROcodone-acetaminophen (NORCO/VICODIN) 5-325 MG per tablet   Oral   Take 1 tablet by mouth every 4 (four) hours as needed for moderate pain.   20  tablet   0   . mirtazapine (REMERON) 30 MG tablet   Oral   Take 1 tablet (30 mg total) by mouth at bedtime.   30 tablet   1   . naproxen (EC NAPROSYN) 500 MG EC tablet   Oral   Take 1 tablet (500 mg total) by mouth 2 (two) times daily with a meal.   30 tablet   0   . ranitidine (ZANTAC) 300 MG tablet   Oral   Take 1 tablet (300 mg total) by mouth at bedtime.   90 tablet   1   . traMADol (ULTRAM) 50 MG tablet   Oral   Take 1 tablet (50 mg total) by mouth 3 (three) times daily as needed.   15 tablet   0    Allergies Review of patient's allergies indicates no known allergies.  Family History  Problem Relation Age of Onset  . Prostate cancer Father     Social History Social History  Substance Use Topics  . Smoking status: Current Every Day Smoker -- 0.30 packs/day for 30 years    Types: Cigarettes  . Smokeless tobacco: Never Used  . Alcohol Use: No   Review of Systems  Constitutional: Negative for fever. Eyes: Negative for visual changes. ENT: Negative for sore throat. Cardiovascular:  Negative for chest pain. Respiratory: Negative for shortness of breath. Gastrointestinal: Negative for abdominal pain, vomiting and diarrhea. Genitourinary: Negative for dysuria. Musculoskeletal: Negative for back pain. Left knee pain as above. Skin: Negative for rash. Neurological: Negative for headaches, focal weakness or numbness. ____________________________________________  PHYSICAL EXAM:  VITAL SIGNS: ED Triage Vitals  Enc Vitals Group     BP 11/25/14 2224 151/78 mmHg     Pulse Rate 11/25/14 2224 96     Resp 11/25/14 2224 20     Temp 11/25/14 2224 98.3 F (36.8 C)     Temp Source 11/25/14 2224 Oral     SpO2 11/25/14 2224 97 %     Weight 11/25/14 2224 243 lb (110.224 kg)     Height 11/25/14 2224 5\' 5"  (1.651 m)     Head Cir --      Peak Flow --      Pain Score 11/25/14 2225 10     Pain Loc --      Pain Edu? --      Excl. in GC? --    Constitutional: Alert  and oriented. Well appearing and in no distress. Head: Normocephalic and atraumatic.      Eyes: Conjunctivae are normal. PERRL. Normal extraocular movements      Ears: Canals clear. TMs intact bilaterally.   Nose: No congestion/rhinorrhea.   Mouth/Throat: Mucous membranes are moist.   Neck: Supple. No thyromegaly. Hematological/Lymphatic/Immunological: No cervical lymphadenopathy. Cardiovascular: Normal rate, regular rhythm.  Respiratory: Normal respiratory effort. No wheezes/rales/rhonchi. Gastrointestinal: Soft and nontender. No distention. Musculoskeletal: Left knee noted to have a small effusion comparison to the right. Patient with normal patella tracking on exam. She is without lateral joint line tenderness. She localizes her pain to the medial aspect of the patella and the medial joint line on examination. She also noted to have some popliteal space tenderness primarily at the gastroc insertion. No popliteal space fullness is noted. Patient with a negative anterior posterior drawer. No valgus or varus joint stress is appreciated. Thompson test is negative. Nontender with normal range of motion in all extremities.  Neurologic:  Normal gait without ataxia. Normal speech and language. No gross focal neurologic deficits are appreciated. Skin:  Skin is warm, dry and intact. No rash noted. Psychiatric: Mood and affect are normal. Patient exhibits appropriate insight and judgment. ____________________________________________   RADIOLOGY Left Knee IMPRESSION: 1. Question of small avulsion fracture involving the fibular head. Would correlate for associated symptoms. This is typically attached to the lateral collateral ligament and/or the biceps femoris tendon. 2. Small knee joint effusion noted.  I, Wallace Gappa, Charlesetta IvoryJenise V Bacon, personally viewed and evaluated these images (plain radiographs) as part of my medical decision making.    ____________________________________________  PROCEDURES  Norco 5-325 mg PO Ace bandage Knee immobilizer ____________________________________________  INITIAL IMPRESSION / ASSESSMENT AND PLAN / ED COURSE  Patient without clinical evidence of lateral knee pain over the fibular head on exam. Her pain is localized to the medial and posterior aspects of the knee.  Patient with a strain to the left knee with small effusion. Also clinical indication of a posterior strain to the gastroc. She is provided with the knee immobilizer and Ace wrap for support. He is also given prescriptions for Naprosyn, tramadol, and Flexeril to dose for continued knee pain. She is given instructions on rest, ice, elevation. And follow-up instructions with Dr. Hyacinth MeekerMiller should symptoms continue to worsen. ____________________________________________  FINAL CLINICAL IMPRESSION(S) / ED DIAGNOSES  Final diagnoses:  Knee sprain  and strain, left, initial encounter  Strain of calf muscle, left, initial encounter  Knee effusion, left      Lissa Hoard, PA-C 11/26/14 0011  Jennye Moccasin, MD 11/26/14 0025

## 2014-11-26 MED ORDER — NAPROXEN 500 MG PO TBEC: 500.0000 mg | DELAYED_RELEASE_TABLET | Freq: Two times a day (BID) | ORAL | Status: DC

## 2014-11-26 MED ORDER — HYDROCODONE-ACETAMINOPHEN 5-325 MG PO TABS
1.0000 | ORAL_TABLET | Freq: Once | ORAL | Status: AC
  Administered 2014-11-26: 1 via ORAL
  Filled 2014-11-26: qty 1

## 2014-11-26 MED ORDER — TRAMADOL HCL 50 MG PO TABS: 50.0000 mg | ORAL_TABLET | Freq: Three times a day (TID) | ORAL | Status: DC | PRN

## 2014-11-26 MED ORDER — CYCLOBENZAPRINE HCL 5 MG PO TABS: 5.0000 mg | ORAL_TABLET | Freq: Three times a day (TID) | ORAL | Status: DC | PRN

## 2014-11-26 NOTE — ED Notes (Signed)
Pt dc home in WC instructed on follow up plan and med use, PT NAD AT DC

## 2014-11-26 NOTE — Discharge Instructions (Signed)
Knee Effusion Knee effusion means that you have extra fluid in your knee. This can cause pain. Your knee may be more difficult to bend and move. HOME CARE  Use crutches as told by your doctor.  Wear a knee brace as told by your doctor.  Apply ice to the swollen area:  Put ice in a plastic bag.  Place a towel between your skin and the bag.  Leave the ice on for 20 minutes, 2-3 times per day.  Keep your knee raised (elevated) when you are sitting or lying down.  Take medicines only as told by your doctor.  Do any rehabilitation or strengthening exercises as told by your doctor.  Rest your knee as told by your doctor. You may start doing your normal activities again when your doctor says it is okay.  Keep all follow-up visits as told by your doctor. This is important. GET HELP IF:   You continue to have pain in your knee. GET HELP RIGHT AWAY IF:  You have increased swelling or redness of your knee.  You have severe pain in your knee.  You have a fever.   This information is not intended to replace advice given to you by your health care provider. Make sure you discuss any questions you have with your health care provider.   Document Released: 02/19/2010 Document Revised: 02/07/2014 Document Reviewed: 09/02/2013 Elsevier Interactive Patient Education 2016 [employer]  Knee Sprain A knee sprain is a tear in the strong bands of tissue that connect the bones (ligaments) of your knee. HOME CARE  Raise (elevate) your injured knee to lessen puffiness (swelling).  To ease pain and puffiness, put ice on the injured area.  Put ice in a plastic bag.  Place a towel between your skin and the bag.  Leave the ice on for 20 minutes, 2-3 times a day.  Only take medicine as told by your doctor.  Do not leave your knee unprotected until pain and stiffness go away (usually 4-6 weeks).  If you have a cast or splint, do not get it wet. If your doctor told you to not take it off,  cover it with a plastic bag when you shower or bathe. Do not swim.  Your doctor may have you do exercises to prevent or limit permanent weakness and stiffness. GET HELP RIGHT AWAY IF:   Your cast or splint becomes damaged.  Your pain gets worse.  You have a lot of pain, puffiness, or numbness below the cast or splint. MAKE SURE YOU:   Understand these instructions.  Will watch your condition.  Will get help right away if you are not doing well or get worse.   This information is not intended to replace advice given to you by your health care provider. Make sure you discuss any questions you have with your health care provider.   Document Released: 01/05/2009 Document Revised: 01/22/2013 Document Reviewed: 09/25/2012 Elsevier Interactive Patient Education 2016 Elsevier Inc.  Muscle Strain A muscle strain (pulled muscle) happens when a muscle is stretched beyond normal length. It happens when a sudden, violent force stretches your muscle too far. Usually, a few of the fibers in your muscle are torn. Muscle strain is common in athletes. Recovery usually takes 1-2 weeks. Complete healing takes 5-6 weeks.  HOME CARE   Follow the PRICE method of treatment to help your injury get better. Do this the first 2-3 days after the injury:  Protect. Protect the muscle to keep it from  getting injured again.  Rest. Limit your activity and rest the injured body part.  Ice. Put ice in a plastic bag. Place a towel between your skin and the bag. Then, apply the ice and leave it on from 15-20 minutes each hour. After the third day, switch to moist heat packs.  Compression. Use a splint or elastic bandage on the injured area for comfort. Do not put it on too tightly.  Elevate. Keep the injured body part above the level of your heart.  Only take medicine as told by your doctor.  Warm up before doing exercise to prevent future muscle strains. GET HELP IF:   You have more pain or puffiness  (swelling) in the injured area.  You feel numbness, tingling, or notice a loss of strength in the injured area. MAKE SURE YOU:   Understand these instructions.  Will watch your condition.  Will get help right away if you are not doing well or get worse.   This information is not intended to replace advice given to you by your health care provider. Make sure you discuss any questions you have with your health care provider.   Document Released: 10/27/2007 Document Revised: 11/07/2012 Document Reviewed: 08/16/2012 Elsevier Interactive Patient Education 2016 Elsevier Inc.   Wear the knee sleeve when out of bed. Rest with the leg elevated to reduce swelling. Apply ice to reduce swelling. Follow-up with Dr. Hyacinth MeekerMiller for continued symptoms. Take the prescription meds as directed.

## 2014-12-04 ENCOUNTER — Telehealth: Payer: Self-pay | Admitting: *Deleted

## 2014-12-04 NOTE — Telephone Encounter (Signed)
Pt c/o of Chest Pain: STAT if CP now or developed within 24 hours  1. Are you having CP right now? Yes   2. Are you experiencing any other symptoms (ex. SOB, nausea, vomiting, sweating)? No  3. How long have you been experiencing CP? For a few days  4. Is your CP continuous or coming and going? No coming and going   5. Have you taken Nitroglycerin? Does not have any . ?

## 2014-12-04 NOTE — Telephone Encounter (Signed)
Pt called back stating she was having SOB and wheezing for a few days and requested to be seen. [facility] since no provider in office until 12/08/14. Pt refused. Pt requesting to see VM but informed pt he was not available. Gave an appt with DS for 12/08/14 @ 1:40pm. [facility] got any [facility] Nothing further needed.

## 2014-12-04 NOTE — Telephone Encounter (Signed)
Tried to call pt but got no answer. LMOM for pt to call back.

## 2014-12-08 ENCOUNTER — Ambulatory Visit: Payer: Self-pay | Admitting: Pulmonary Disease

## 2014-12-15 ENCOUNTER — Encounter: Payer: Self-pay | Admitting: Internal Medicine

## 2014-12-15 ENCOUNTER — Ambulatory Visit (INDEPENDENT_AMBULATORY_CARE_PROVIDER_SITE_OTHER): Payer: Self-pay | Admitting: Internal Medicine

## 2014-12-15 VITALS — BP 126/73 | HR 104 | Ht 64.0 in | Wt 257.8 lb

## 2014-12-15 DIAGNOSIS — R05 Cough: Secondary | ICD-10-CM

## 2014-12-15 DIAGNOSIS — R059 Cough, unspecified: Secondary | ICD-10-CM

## 2014-12-15 DIAGNOSIS — Z716 Tobacco abuse counseling: Secondary | ICD-10-CM

## 2014-12-15 DIAGNOSIS — J4541 Moderate persistent asthma with (acute) exacerbation: Secondary | ICD-10-CM

## 2014-12-15 DIAGNOSIS — J4530 Mild persistent asthma, uncomplicated: Secondary | ICD-10-CM

## 2014-12-15 DIAGNOSIS — J45901 Unspecified asthma with (acute) exacerbation: Secondary | ICD-10-CM | POA: Insufficient documentation

## 2014-12-15 DIAGNOSIS — R0600 Dyspnea, unspecified: Secondary | ICD-10-CM

## 2014-12-15 MED ORDER — FLUTICASONE FUROATE 200 MCG/ACT IN AEPB: 1.0000 | INHALATION_SPRAY | Freq: Every day | RESPIRATORY_TRACT | Status: AC

## 2014-12-15 MED ORDER — AZITHROMYCIN 250 MG PO TABS
ORAL_TABLET | ORAL | Status: AC
Start: 2014-12-15 — End: 2014-12-20

## 2014-12-15 MED ORDER — FLUTICASONE FUROATE 200 MCG/ACT IN AEPB: 1.0000 | INHALATION_SPRAY | Freq: Every day | RESPIRATORY_TRACT | Status: DC

## 2014-12-15 MED ORDER — PREDNISONE 10 MG (21) PO TBPK: 10.0000 mg | ORAL_TABLET | Freq: Every day | ORAL | Status: DC

## 2014-12-15 MED ORDER — PREDNISONE 5 MG PO TABS: ORAL_TABLET | ORAL | Status: DC

## 2014-12-15 MED ORDER — BECLOMETHASONE DIPROPIONATE 40 MCG/ACT IN AERS: 2.0000 | INHALATION_SPRAY | Freq: Two times a day (BID) | RESPIRATORY_TRACT | Status: DC

## 2014-12-15 NOTE — Patient Instructions (Addendum)
Follow up with Dr. Dema SeverinMungal in: 3 months - complete out your current Qvar, then start Arunity 200 (1 puff daily)-gargle and rinse after each use.  - albuterol inhaler - 2puff every 6 hours for 3 days, then 2puff as needed every 3-4 hours as needed for shortness of breath\wheezing\recurrent cough - Zpak - Prednisone 30mg  x 5 days, take daily with breakfast - if no improvement, call us back, we will then get a chest xray.  - no flu shot today

## 2014-12-15 NOTE — Assessment & Plan Note (Signed)
Tobacco Cessation - Counseling regarding benefits of smoking cessation strategies was provided for more than 12 min. - Educated that at this time smoking- cessation represents the single most important step that patient can take to enhance the length and quality of live. - Educated patient regarding alternatives of behavior interventions, pharmacotherapy including NRT and non-nicotine therapy such, and combinations of both. - Patient at this time:trying to quit on her own.
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
## 2014-12-15 NOTE — Assessment & Plan Note (Signed)
Differential diagnosis includes: asthma, continued tobacco abuse, obesity, deconditioning, Dyspnea is improving well overall.  I believe that her dyspnea is a multifactorial process in combination with tobacco abuse, obesity and deconditioning. Patient had pulmonary function testing done today that showed a FEV1/FVC 80, FEV1 79, RV 87 TLC 80 ERV 21. Essentially normal pulmonary function testing with severely decreased ERV was likely secondary to abdominal obesity. She still endorses cough and shortness of breath does not really relieve with albuterol, the symptoms mostly occur with exertion. Given her clinical history, will continue to treat as asthma. She is still uncontrolled having nighttime shortness of breath and mild cough, when laying flat and use of rescue inhaler.  This episode is mostly likely secondary to asthma/allergies.   Plan: -Continue with Qvar 1 puff twice a day, gargle and rinse after each use -Nighttime symptoms concerning for acid reflux that maybe triggering asthma. Continue with ranitidine 300 mg 1 tab in the morning, given to patient. -Diet, weight loss, exercise. -Avoid tobacco.

## 2014-12-15 NOTE — Assessment & Plan Note (Addendum)
Patient with mild persistent asthma. - initially managed with low dose Qvar She is currently still smoking - about 1-2 cigs per day Still with cough 3-5 times a week with use of rescue inhaler especially at night, she is uncontrolled at this time. I believe that acid refluxes causing some of her symptoms for worsening asthma symptoms at night, currently on H2 blocker.   Currently, with mild exacerbation, will treat as such (prednisone, zpak, scheduled albuterol use)  Plan for Asthma: -Due to insurance issues, will stop Qvar, start Arunity 200 (1 puff daily) with coupon card -Ranitidine 300 mg: 1 tablet at night, with full glass of water on an empty stomach. -Avoid tobacco -Diet, weight loss, and healthy daily exercise.

## 2014-12-15 NOTE — Assessment & Plan Note (Signed)
Multifactorial: continued tobacco abuse/acid reflux  Currently, cough is most likely related to uncontrolled asthma and allergies.   Plan: -  see plan for dyspnea - supportive care,Tobacco Cessation

## 2014-12-15 NOTE — Assessment & Plan Note (Signed)
Trigger - weather changes  Currently, with mid exacerbation.  Plan: Prednisone 30mg  x 5day  zpak scheduled albuterol use - 2 puff q6hrs x 3 days, then as needed q4hrs for SOB/Wheezing/coughing spells

## 2015-01-12 ENCOUNTER — Encounter: Payer: Self-pay | Admitting: Emergency Medicine

## 2015-01-12 ENCOUNTER — Emergency Department
Admission: EM | Admit: 2015-01-12 | Discharge: 2015-01-12 | Disposition: A | Discharge status: Home / Self Care | Payer: Self-pay | Attending: Emergency Medicine | Admitting: Emergency Medicine

## 2015-01-12 ENCOUNTER — Emergency Department: Payer: Self-pay

## 2015-01-12 DIAGNOSIS — Z7951 Long term (current) use of inhaled steroids: Secondary | ICD-10-CM | POA: Insufficient documentation

## 2015-01-12 DIAGNOSIS — X58XXXD Exposure to other specified factors, subsequent encounter: Secondary | ICD-10-CM | POA: Insufficient documentation

## 2015-01-12 DIAGNOSIS — Z791 Long term (current) use of non-steroidal anti-inflammatories (NSAID): Secondary | ICD-10-CM | POA: Insufficient documentation

## 2015-01-12 DIAGNOSIS — Z79899 Other long term (current) drug therapy: Secondary | ICD-10-CM | POA: Insufficient documentation

## 2015-01-12 DIAGNOSIS — S82832D Other fracture of upper and lower end of left fibula, subsequent encounter for closed fracture with routine healing: Secondary | ICD-10-CM | POA: Insufficient documentation

## 2015-01-12 DIAGNOSIS — I10 Essential (primary) hypertension: Secondary | ICD-10-CM | POA: Insufficient documentation

## 2015-01-12 DIAGNOSIS — S82402D Unspecified fracture of shaft of left fibula, subsequent encounter for closed fracture with routine healing: Secondary | ICD-10-CM

## 2015-01-12 DIAGNOSIS — Z7952 Long term (current) use of systemic steroids: Secondary | ICD-10-CM | POA: Insufficient documentation

## 2015-01-12 DIAGNOSIS — F17210 Nicotine dependence, cigarettes, uncomplicated: Secondary | ICD-10-CM | POA: Insufficient documentation

## 2015-01-12 MED ORDER — IBUPROFEN 800 MG PO TABS: 800.0000 mg | ORAL_TABLET | Freq: Three times a day (TID) | ORAL | Status: DC | PRN

## 2015-01-12 MED ORDER — HYDROCODONE-ACETAMINOPHEN 5-325 MG PO TABS
2.0000 | ORAL_TABLET | Freq: Once | ORAL | Status: AC
Start: 2015-01-12 — End: 2015-01-12
  Administered 2015-01-12: 2 via ORAL
  Filled 2015-01-12: qty 2

## 2015-01-12 MED ORDER — KETOROLAC TROMETHAMINE 60 MG/2ML IM SOLN
60.0000 mg | Freq: Once | INTRAMUSCULAR | Status: AC
Start: 2015-01-12 — End: 2015-01-12
  Administered 2015-01-12: 60 mg via INTRAMUSCULAR
  Filled 2015-01-12: qty 2

## 2015-01-12 MED ORDER — OXYCODONE-ACETAMINOPHEN 5-325 MG PO TABS: 1.0000 | ORAL_TABLET | ORAL | Status: DC | PRN

## 2015-01-12 NOTE — ED Notes (Signed)
See triage . Min swelling noted to left knee   And increased pain with standing

## 2015-01-12 NOTE — ED Provider Notes (Signed)
[facility] Emergency Department Provider Note  ____________________________________________  Time seen: Approximately 2:15 PM  I have reviewed the triage vital signs and the nursing notes.   HISTORY  Chief Complaint Knee Pain   HPI Andrea Kim is a 44 y.o. female who presents for evaluation of left knee pain. Patient states that she initially developed a several weeks ago but now has gotten worse pain with acute swelling and feels like there is a "fever in it".   Past Medical History  Diagnosis Date  . Hypertension   . Bronchitis   . Asthma     Patient Active Problem List   Diagnosis Date Noted  . Asthma with exacerbation 12/15/2014  . Depression, major, single episode, severe (HCC) 10/02/2014  . Asthma, chronic 07/17/2014  . Cough 03/20/2014  . Dyspnea 03/20/2014  . Tobacco abuse counseling 03/20/2014    Past Surgical History  Procedure Laterality Date  . Back surgery  05/2013  . Etopic pregnancy    . Abdominal hysterectomy      Current Outpatient Rx  Name  Route  Sig  Dispense  Refill  . albuterol (PROVENTIL HFA;VENTOLIN HFA) 108 (90 BASE) MCG/ACT inhaler   Inhalation   Inhale 2 puffs into the lungs every 4 (four) hours as needed for wheezing or shortness of breath.   1 Inhaler   2   . beclomethasone (QVAR) 40 MCG/ACT inhaler   Inhalation   Inhale 2 puffs into the lungs 2 (two) times daily.   1 Inhaler   2   . Fluticasone Furoate (ARNUITY ELLIPTA) 200 MCG/ACT AEPB   Inhalation   Inhale 1 puff into the lungs daily.   30 each   11   . ibuprofen (ADVIL,MOTRIN) 800 MG tablet   Oral   Take 1 tablet (800 mg total) by mouth every 8 (eight) hours as needed.   30 tablet   0   . mirtazapine (REMERON) 30 MG tablet   Oral   Take 1 tablet (30 mg total) by mouth at bedtime.   30 tablet   1   . naproxen (EC NAPROSYN) 500 MG EC tablet   Oral   Take 1 tablet (500 mg total) by mouth 2 (two) times daily with a meal.   30  tablet   0   . oxyCODONE-acetaminophen (ROXICET) 5-325 MG tablet   Oral   Take 1-2 tablets by mouth every 4 (four) hours as needed for severe pain.   15 tablet   0   . predniSONE (DELTASONE) 5 MG tablet      Take 6 tablets daily for 5 days   30 tablet   0   . ranitidine (ZANTAC) 300 MG tablet   Oral   Take 1 tablet (300 mg total) by mouth at bedtime.   90 tablet   1     Allergies Review of patient's allergies indicates no known allergies.  Family History  Problem Relation Age of Onset  . Prostate cancer Father     Social History Social History  Substance Use Topics  . Smoking status: Current Every Day Smoker -- 0.30 packs/day for 30 years    Types: Cigarettes  . Smokeless tobacco: Never Used  . Alcohol Use: No   Review of Systems Constitutional: No fever/chills Eyes: No visual changes. ENT: No sore throat. Cardiovascular: Denies chest pain. Respiratory: Denies shortness of breath. Gastrointestinal: No abdominal pain.  No nausea, no vomiting.  No diarrhea.  No constipation. Genitourinary: Negative for dysuria. Musculoskeletal:  Positive for left knee pain with edema warmth and tenderness. Full range of motion increased pain with flexion. Skin: Negative for rash. Neurological: Negative for headaches, focal weakness or numbness.  10-point ROS otherwise negative.  ____________________________________________   PHYSICAL EXAM:  VITAL SIGNS: ED Triage Vitals  Enc Vitals Group     BP 01/12/15 1330 161/100 mmHg     Pulse Rate 01/12/15 1330 90     Resp 01/12/15 1330 18     Temp 01/12/15 1330 98.3 F (36.8 C)     Temp Source 01/12/15 1330 Oral     SpO2 01/12/15 1330 95 %     Weight 01/12/15 1326 253 lb (114.76 kg)     Height 01/12/15 1326  (1.651 m)     Head Cir --      Peak Flow --      Pain Score 01/12/15 1326 8     Pain Loc --      Pain Edu? --      Excl. in GC? --     Constitutional: Alert and oriented. Well appearing and in no acute  distress.   Cardiovascular: Normal rate, regular rhythm. Grossly normal heart sounds.  Good peripheral circulation. Respiratory: Normal respiratory effort.  No retractions. Lungs CTAB. Musculoskeletal: Left knee with positive tenderness and edema. Neurologic:  Normal speech and language. No gross focal neurologic deficits are appreciated. No gait instability. Skin:  Skin is warm, dry and intact. No rash noted. Psychiatric: Mood and affect are normal. Speech and behavior are normal.  ____________________________________________   LABS (all labs ordered are listed, but only abnormal results are displayed)  Labs Reviewed - No data to display   RADIOLOGY  No acute fracture or dislocation. Joint effusion has resolved. Small suspected avulsion from the superolateral fibular head is redemonstrated, and stable.  IMPRESSION: Resolved joint effusion. Suspected avulsion superolateral fibular head stable from October 2016. ____________________________________________   PROCEDURES  Procedure(s) performed: None  Critical Care performed: No  ____________________________________________   INITIAL IMPRESSION / ASSESSMENT AND PLAN / ED COURSE  Pertinent labs & imaging results that were available during my care of the patient were reviewed by me and considered in my medical decision making (see chart for details).  Superolateral fibular head avulsion fracture. Patient encouraged to use her knee immobilizer that she was given at the last visit. Rx given for Motrin 800 mg 3 times a day and Vicodin 5/325. Follow-up with attending orthopedic on call number was provided at discharge. Patient voices no other emergency medical complaints at this time. ____________________________________________   FINAL CLINICAL IMPRESSION(S) / ED DIAGNOSES  Final diagnoses:  Closed fibular fracture, left, with routine healing, subsequent encounter      Evangeline Dakin, PA-C 01/12/15 1552  Arnaldo Natal, MD 01/14/15 1520

## 2015-01-12 NOTE — ED Notes (Signed)
AAOx3.  Skin warm and dry.  NAD 

## 2015-01-12 NOTE — ED Notes (Signed)
States she developed left knee several weeks ago .but now having pain to lateral/popst knee with some swelling and numbness to lower leg

## 2015-01-12 NOTE — ED Notes (Signed)
AAOx3.  Skin warm and dry.  Ambulates with easy and steady gait. NAD 

## 2015-03-12 ENCOUNTER — Ambulatory Visit: Payer: Self-pay | Admitting: Internal Medicine

## 2015-03-12 ENCOUNTER — Encounter: Payer: Self-pay | Admitting: *Deleted

## 2015-05-17 ENCOUNTER — Emergency Department: Payer: Self-pay

## 2015-05-17 ENCOUNTER — Encounter: Payer: Self-pay | Admitting: Emergency Medicine

## 2015-05-17 ENCOUNTER — Emergency Department
Admission: EM | Admit: 2015-05-17 | Discharge: 2015-05-17 | Disposition: A | Discharge status: Home / Self Care | Payer: Self-pay | Attending: Emergency Medicine | Admitting: Emergency Medicine

## 2015-05-17 DIAGNOSIS — Z7951 Long term (current) use of inhaled steroids: Secondary | ICD-10-CM | POA: Insufficient documentation

## 2015-05-17 DIAGNOSIS — Z7952 Long term (current) use of systemic steroids: Secondary | ICD-10-CM | POA: Insufficient documentation

## 2015-05-17 DIAGNOSIS — J441 Chronic obstructive pulmonary disease with (acute) exacerbation: Secondary | ICD-10-CM | POA: Insufficient documentation

## 2015-05-17 DIAGNOSIS — F17210 Nicotine dependence, cigarettes, uncomplicated: Secondary | ICD-10-CM | POA: Insufficient documentation

## 2015-05-17 DIAGNOSIS — N390 Urinary tract infection, site not specified: Secondary | ICD-10-CM | POA: Insufficient documentation

## 2015-05-17 DIAGNOSIS — R609 Edema, unspecified: Secondary | ICD-10-CM | POA: Insufficient documentation

## 2015-05-17 DIAGNOSIS — F329 Major depressive disorder, single episode, unspecified: Secondary | ICD-10-CM | POA: Insufficient documentation

## 2015-05-17 DIAGNOSIS — I10 Essential (primary) hypertension: Secondary | ICD-10-CM | POA: Insufficient documentation

## 2015-05-17 HISTORY — DX: Edema, unspecified: R60.9

## 2015-05-17 LAB — CBC
HCT: 38.4 % (ref 35.0–47.0)
Hemoglobin: 12.8 g/dL (ref 12.0–16.0)
MCH: 30.2 pg (ref 26.0–34.0)
MCHC: 33.2 g/dL (ref 32.0–36.0)
MCV: 90.9 fL (ref 80.0–100.0)
PLATELETS: 228 10*3/uL (ref 150–440)
RBC: 4.23 MIL/uL (ref 3.80–5.20)
RDW: 14.4 % (ref 11.5–14.5)
WBC: 7 10*3/uL (ref 3.6–11.0)

## 2015-05-17 LAB — COMPREHENSIVE METABOLIC PANEL
ALT: 19 U/L (ref 14–54)
AST: 15 U/L (ref 15–41)
Albumin: 4.1 g/dL (ref 3.5–5.0)
Alkaline Phosphatase: 70 U/L (ref 38–126)
Anion gap: 4 — ABNORMAL LOW (ref 5–15)
BUN: 12 mg/dL (ref 6–20)
CHLORIDE: 109 mmol/L (ref 101–111)
CO2: 25 mmol/L (ref 22–32)
CREATININE: 1.01 mg/dL — AB (ref 0.44–1.00)
Calcium: 8.7 mg/dL — ABNORMAL LOW (ref 8.9–10.3)
Glucose, Bld: 94 mg/dL (ref 65–99)
POTASSIUM: 3.3 mmol/L — AB (ref 3.5–5.1)
Sodium: 138 mmol/L (ref 135–145)
Total Bilirubin: 0.6 mg/dL (ref 0.3–1.2)
Total Protein: 6.9 g/dL (ref 6.5–8.1)

## 2015-05-17 LAB — URINALYSIS COMPLETE WITH MICROSCOPIC (ARMC ONLY)
BILIRUBIN URINE: NEGATIVE
GLUCOSE, UA: NEGATIVE mg/dL
Nitrite: POSITIVE — AB
Protein, ur: 30 mg/dL — AB
Specific Gravity, Urine: 1.031 — ABNORMAL HIGH (ref 1.005–1.030)
pH: 5 (ref 5.0–8.0)

## 2015-05-17 LAB — BRAIN NATRIURETIC PEPTIDE: B NATRIURETIC PEPTIDE 5: 91 pg/mL (ref 0.0–100.0)

## 2015-05-17 LAB — TROPONIN I: Troponin I: <0.03 ng/mL (ref <0.031)

## 2015-05-17 MED ORDER — CEPHALEXIN 500 MG PO CAPS: 500.0000 mg | ORAL_CAPSULE | Freq: Three times a day (TID) | ORAL | Status: DC

## 2015-05-17 MED ORDER — FUROSEMIDE 20 MG PO TABS
20.0000 mg | ORAL_TABLET | Freq: Every day | ORAL | Status: DC
Start: 2015-05-17 — End: 2017-04-13

## 2015-05-17 MED ORDER — OXYCODONE-ACETAMINOPHEN 5-325 MG PO TABS
1.0000 | ORAL_TABLET | Freq: Once | ORAL | Status: AC
Start: 2015-05-17 — End: 2015-05-17
  Administered 2015-05-17: 1 via ORAL

## 2015-05-17 MED ORDER — FUROSEMIDE 20 MG PO TABS
20.0000 mg | ORAL_TABLET | Freq: Once | ORAL | Status: AC
  Administered 2015-05-17: 20 mg via ORAL

## 2015-05-17 MED ORDER — OXYCODONE-ACETAMINOPHEN 5-325 MG PO TABS: 1.0000 | ORAL_TABLET | Freq: Four times a day (QID) | ORAL | Status: DC | PRN

## 2015-05-17 NOTE — ED Provider Notes (Signed)
[facility] Emergency Department Provider Note  Time seen: 4:04 PM  I have reviewed the triage vital signs and the nursing notes.   HISTORY  Chief Complaint Leg Swelling    HPI Andrea Kim is a 45 y.o. female with a past medical history of hypertension, asthma, lower extremity edema, presents the emergency department for lower extremity swelling as well as intermittent lower abdominal pain. According to the patient for the past 6 months she's been having lower abdominal pain which comes intermittently which she has been told is due to ovarian cysts. Denies any acute worsening of the pain but states the pain is present. Denies any dysuria. Patient also states 3 days of lower extremity edema in bilateral lower extremities. States a history of peripheral edema, states her legs also swelled several months ago but resolved on their own. Patient has never seen a cardiologist. No known history of congestive heart failure.Describes her lower abdominal pain is mild, intermittent dull aching sensation to her lower abdomen. States mild to moderate lower extremity pain bilaterally due to the tightness and swelling. Denies any fever.     Past Medical History  Diagnosis Date  . Hypertension   . Bronchitis   . Asthma   . Edema     Patient Active Problem List   Diagnosis Date Noted  . Asthma with exacerbation 12/15/2014  . Depression, major, single episode, severe (HCC) 10/02/2014  . Asthma, chronic 07/17/2014  . Cough 03/20/2014  . Dyspnea 03/20/2014  . Tobacco abuse counseling 03/20/2014    Past Surgical History  Procedure Laterality Date  . Back surgery  05/2013  . Etopic pregnancy    . Abdominal hysterectomy      Current Outpatient Rx  Name  Route  Sig  Dispense  Refill  . albuterol (PROVENTIL HFA;VENTOLIN HFA) 108 (90 BASE) MCG/ACT inhaler   Inhalation   Inhale 2 puffs into the lungs every 4 (four) hours as needed for wheezing or shortness of  breath.   1 Inhaler   2   . beclomethasone (QVAR) 40 MCG/ACT inhaler   Inhalation   Inhale 2 puffs into the lungs 2 (two) times daily.   1 Inhaler   2   . Fluticasone Furoate (ARNUITY ELLIPTA) 200 MCG/ACT AEPB   Inhalation   Inhale 1 puff into the lungs daily.   30 each   11   . ibuprofen (ADVIL,MOTRIN) 800 MG tablet   Oral   Take 1 tablet (800 mg total) by mouth every 8 (eight) hours as needed.   30 tablet   0   . mirtazapine (REMERON) 30 MG tablet   Oral   Take 1 tablet (30 mg total) by mouth at bedtime.   30 tablet   1   . naproxen (EC NAPROSYN) 500 MG EC tablet   Oral   Take 1 tablet (500 mg total) by mouth 2 (two) times daily with a meal.   30 tablet   0   . oxyCODONE-acetaminophen (ROXICET) 5-325 MG tablet   Oral   Take 1-2 tablets by mouth every 4 (four) hours as needed for severe pain.   15 tablet   0   . predniSONE (DELTASONE) 5 MG tablet      Take 6 tablets daily for 5 days   30 tablet   0   . ranitidine (ZANTAC) 300 MG tablet   Oral   Take 1 tablet (300 mg total) by mouth at bedtime.   90 tablet   1  Allergies Review of patient's allergies indicates no known allergies.  Family History  Problem Relation Age of Onset  . Prostate cancer Father     Social History Social History  Substance Use Topics  . Smoking status: Current Every Day Smoker -- 0.50 packs/day for 30 years    Types: Cigarettes  . Smokeless tobacco: Never Used  . Alcohol Use: No    Review of Systems Constitutional: Negative for fever. Cardiovascular: Negative for chest pain. Respiratory: Negative for shortness of breath. Gastrointestinal: Mild lower abdominal pain intermittent 6 months. Genitourinary: Negative for dysuria. Musculoskeletal: Negative for back pain. Positive for lower extremity swelling. Neurological: Negative for headache 10-point ROS otherwise negative.  ____________________________________________   PHYSICAL EXAM:  VITAL SIGNS: ED Triage  Vitals  Enc Vitals Group     BP 05/17/15 1416 135/86 mmHg     Pulse Rate 05/17/15 1416 98     Resp 05/17/15 1416 15     Temp 05/17/15 1416 98.4 F (36.9 C)     Temp Source 05/17/15 1416 Oral     SpO2 05/17/15 1416 98 %     Weight 05/17/15 1416 243 lb (110.224 kg)     Height 05/17/15 1416 5\' 5"  (1.651 m)     Head Cir --      Peak Flow --      Pain Score 05/17/15 1419 7     Pain Loc --      Pain Edu? --      Excl. in GC? --     Constitutional: Alert and oriented. Well appearing and in no distress. Eyes: Normal exam ENT   Head: Normocephalic and atraumatic.   Mouth/Throat: Mucous membranes are moist. Cardiovascular: Normal rate, regular rhythm. No murmurs, rubs, or gallops. Respiratory: Normal respiratory effort without tachypnea nor retractions. Breath sounds are clear  Gastrointestinal: Soft, mild lower abdominal tenderness palpation. No rebound or guarding. No distention. Patient states this is chronic 6 months. Musculoskeletal: Nontender with normal range of motion in all extremities. 1+ pitting edema to bilateral lower extremities. No calf tenderness, no erythema. Neurovascularly intact. Neurologic:  Normal speech and language. No gross focal neurologic deficits Skin:  Skin is warm, dry and intact.  Psychiatric: Mood and affect are normal. Speech and behavior are normal.   ____________________________________________   RADIOLOGY  Chronic vascular congestion, no acute findings  ____________________________________________    INITIAL IMPRESSION / ASSESSMENT AND PLAN / ED COURSE  Pertinent labs & imaging results that were available during my care of the patient were reviewed by me and considered in my medical decision making (see chart for details).  Patient presents for lower extremity swelling 3 days, also moderate pain in her bilateral lower extremities due to the tightness and swelling. Given her lower abdominal discomfort we will check a urinalysis. Lab  work is otherwise within normal limits. I have added on a troponin, and a chest x-ray to evaluate for CHF. His lungs he chest x-ray and troponin are normal/negative patient will be discharged with furosemide and follow up with a cardiologist for an echocardiogram. Patient agreeable to plan.  Labs are largely within normal limits, troponin negative. Chest x-ray shows chronic vascular congestion without acute abnormality. Urinalysis consistent with urinary tract infection, nitrite positive. We will send a urine culture. We'll treat the patient with antibiotics, Keflex 3 times a day for the next 7 days. We will have the patient follow up with cardiology for an echocardiogram, I will place the patient on furosemide 20 mg once daily for  the next 5 days.  ____________________________________________   FINAL CLINICAL IMPRESSION(S) / ED DIAGNOSES  Peripheral edema Abdominal pain UTI  Minna Antis, MD 05/17/15 1733

## 2015-05-17 NOTE — Discharge Instructions (Signed)
You've been seen in the emergency department for swelling and pain. Your workup is consistent with peripheral edema. Please take your medications as prescribed. Please call the number provided for cardiology to arrange a follow-up appointment for echocardiogram as soon as possible. Return to the emergency department for any chest pain, trouble breathing, or worsening edema.   Peripheral Edema You have swelling in your legs (peripheral edema). This swelling is due to excess accumulation of salt and water in your body. Edema may be a sign of heart, kidney or liver disease, or a side effect of a medication. It may also be due to problems in the leg veins. Elevating your legs and using special support stockings may be very helpful, if the cause of the swelling is due to poor venous circulation. Avoid long periods of standing, whatever the cause. Treatment of edema depends on identifying the cause. Chips, pretzels, pickles and other salty foods should be avoided. Restricting salt in your diet is almost always needed. Water pills (diuretics) are often used to remove the excess salt and water from your body via urine. These medicines prevent the kidney from reabsorbing sodium. This increases urine flow. Diuretic treatment may also result in lowering of potassium levels in your body. Potassium supplements may be needed if you have to use diuretics daily. Daily weights can help you keep track of your progress in clearing your edema. You should call your caregiver for follow up care as recommended. SEEK IMMEDIATE MEDICAL CARE IF:   You have increased swelling, pain, redness, or heat in your legs.  You develop shortness of breath, especially when lying down.  You develop chest or abdominal pain, weakness, or fainting.  You have a fever.   This information is not intended to replace advice given to you by your health care provider. Make sure you discuss any questions you have with your health care provider.     Document Released: 02/25/2004 Document Revised: 04/11/2011 Document Reviewed: 07/30/2014 Elsevier Interactive Patient Education 2016 [employer]  Urinary Tract Infection A urinary tract infection (UTI) can occur any place along the urinary tract. The tract includes the kidneys, ureters, bladder, and urethra. A type of germ called bacteria often causes a UTI. UTIs are often helped with antibiotic medicine.  HOME CARE   If given, take antibiotics as told by your doctor. Finish them even if you start to feel better.  Drink enough fluids to keep your pee (urine) clear or pale yellow.  Avoid tea, drinks with caffeine, and bubbly (carbonated) drinks.  Pee often. Avoid holding your pee in for a long time.  Pee before and after having sex (intercourse).  Wipe from front to back after you poop (bowel movement) if you are a woman. Use each tissue only once. GET HELP RIGHT AWAY IF:   You have back pain.  You have lower belly (abdominal) pain.  You have chills.  You feel sick to your stomach (nauseous).  You throw up (vomit).  Your burning or discomfort with peeing does not go away.  You have a fever.  Your symptoms are not better in 3 days. MAKE SURE YOU:   Understand these instructions.  Will watch your condition.  Will get help right away if you are not doing well or get worse.   This information is not intended to replace advice given to you by your health care provider. Make sure you discuss any questions you have with your health care provider.   Document Released: 07/06/2007 Document Revised:  02/07/2014 Document Reviewed: 08/18/2011 Elsevier Interactive Patient Education [employer]2016 [employer]

## 2015-05-17 NOTE — ED Notes (Signed)
Trouble with leg swelling x 1 year - bil leg swelling recently x 3 days.

## 2015-05-21 LAB — URINE CULTURE

## 2015-07-14 ENCOUNTER — Encounter: Payer: Self-pay | Admitting: *Deleted

## 2015-07-14 ENCOUNTER — Ambulatory Visit: Payer: Self-pay | Admitting: Internal Medicine

## 2015-07-18 IMAGING — CR DG CHEST 1V PORT
1 series · 1 of 1 positions shown · non-contrast
Comparison: 03/13/2014

CLINICAL DATA: Cough and dyspnea

EXAM:
PORTABLE CHEST - 1 VIEW

[ap]
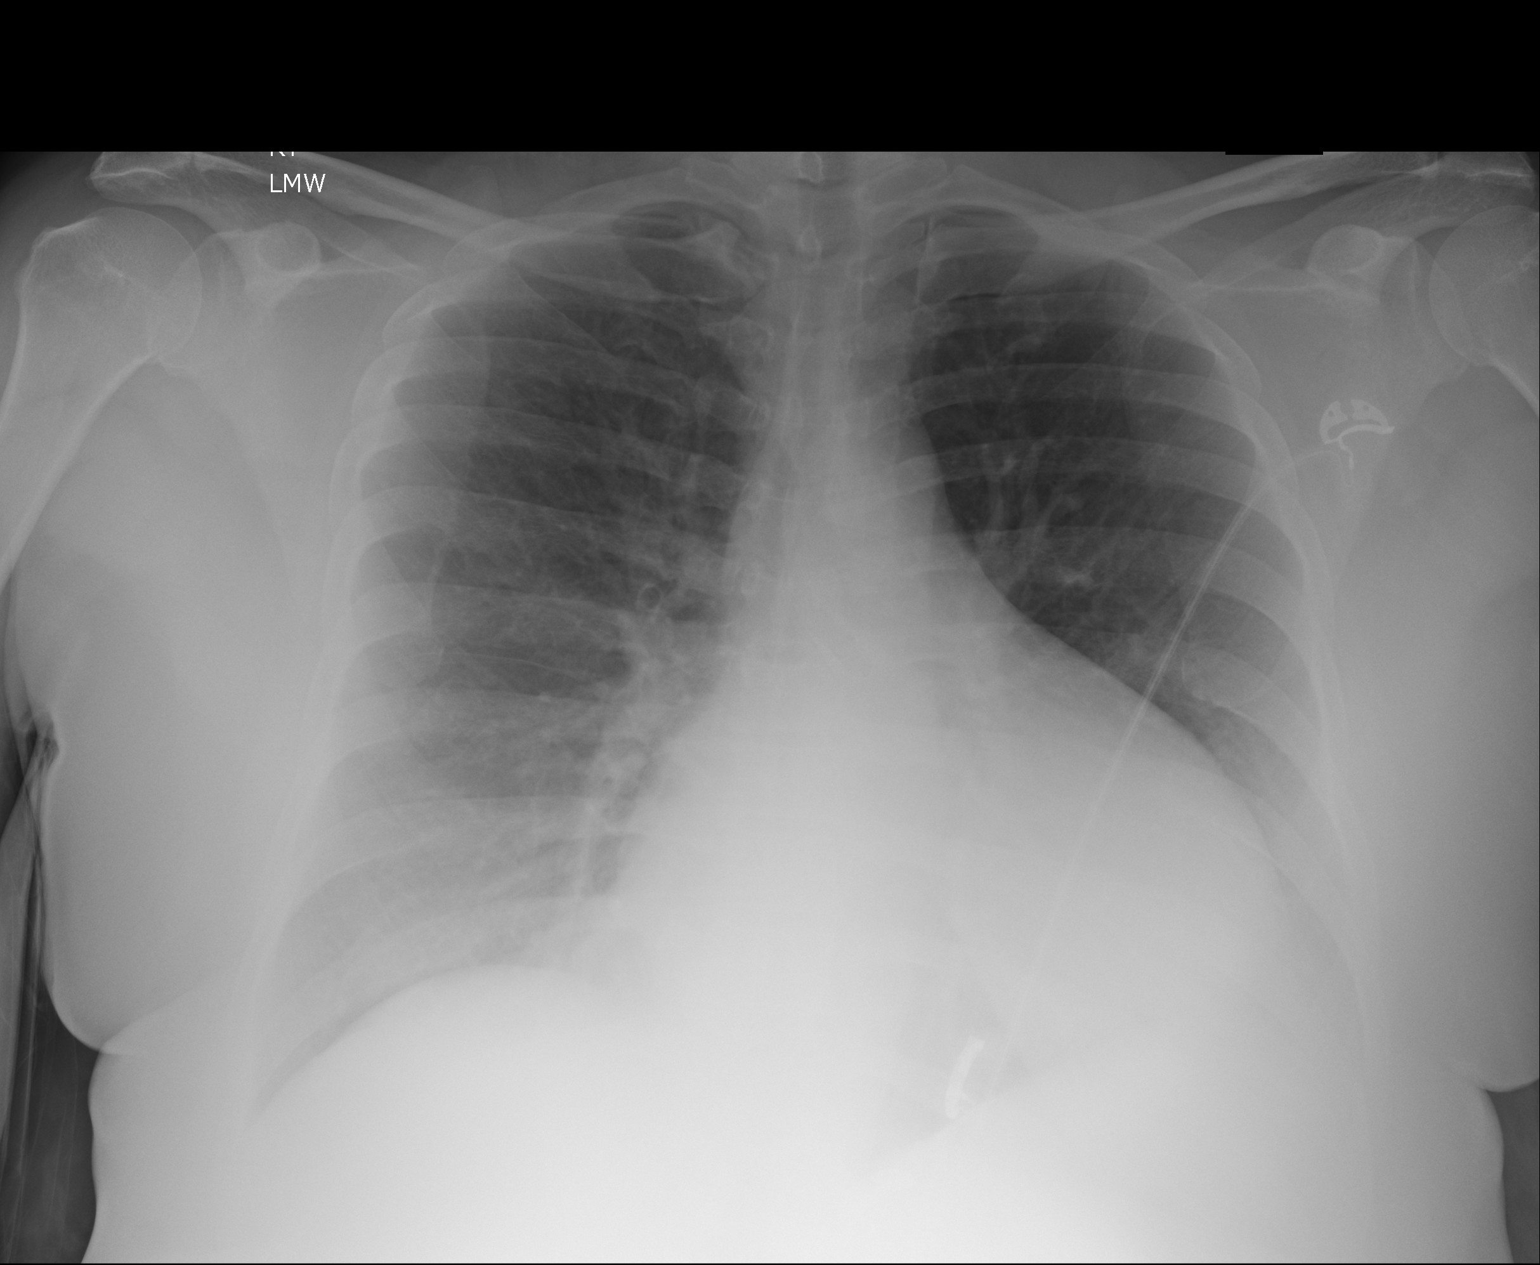

[1 of 1 positions shown; findings below may reference images not displayed]

FINDINGS: There is moderate unchanged cardiomegaly. The lungs are clear. There
is no large effusion. Pulmonary vasculature is normal.
IMPRESSION: Cardiomegaly.  No acute findings.

## 2016-01-06 ENCOUNTER — Emergency Department
Admission: EM | Admit: 2016-01-06 | Discharge: 2016-01-06 | Disposition: A | Discharge status: Home / Self Care | Payer: Self-pay | Attending: Student in an Organized Health Care Education/Training Program | Admitting: Student in an Organized Health Care Education/Training Program

## 2016-01-06 ENCOUNTER — Emergency Department: Payer: Self-pay

## 2016-01-06 ENCOUNTER — Encounter: Payer: Self-pay | Admitting: Emergency Medicine

## 2016-01-06 DIAGNOSIS — F17210 Nicotine dependence, cigarettes, uncomplicated: Secondary | ICD-10-CM | POA: Insufficient documentation

## 2016-01-06 DIAGNOSIS — R102 Pelvic and perineal pain: Secondary | ICD-10-CM

## 2016-01-06 DIAGNOSIS — I10 Essential (primary) hypertension: Secondary | ICD-10-CM | POA: Insufficient documentation

## 2016-01-06 DIAGNOSIS — Z79899 Other long term (current) drug therapy: Secondary | ICD-10-CM | POA: Insufficient documentation

## 2016-01-06 DIAGNOSIS — N3000 Acute cystitis without hematuria: Secondary | ICD-10-CM | POA: Insufficient documentation

## 2016-01-06 DIAGNOSIS — J45909 Unspecified asthma, uncomplicated: Secondary | ICD-10-CM | POA: Insufficient documentation

## 2016-01-06 LAB — COMPREHENSIVE METABOLIC PANEL
ALBUMIN: 4 g/dL (ref 3.5–5.0)
ALK PHOS: 66 U/L (ref 38–126)
ALT: 63 U/L — AB (ref 14–54)
AST: 63 U/L — AB (ref 15–41)
Anion gap: 7 (ref 5–15)
BUN: 14 mg/dL (ref 6–20)
CALCIUM: 9 mg/dL (ref 8.9–10.3)
CHLORIDE: 106 mmol/L (ref 101–111)
CO2: 26 mmol/L (ref 22–32)
CREATININE: 1.14 mg/dL — AB (ref 0.44–1.00)
GFR calc Af Amer: >60 mL/min (ref >60)
GFR calc non Af Amer: 56 mL/min — ABNORMAL LOW (ref >60)
GLUCOSE: 92 mg/dL (ref 65–99)
Potassium: 4.2 mmol/L (ref 3.5–5.1)
SODIUM: 139 mmol/L (ref 135–145)
Total Bilirubin: 0.6 mg/dL (ref 0.3–1.2)
Total Protein: 6.9 g/dL (ref 6.5–8.1)

## 2016-01-06 LAB — URINALYSIS, COMPLETE (UACMP) WITH MICROSCOPIC
Bilirubin Urine: NEGATIVE
GLUCOSE, UA: NEGATIVE mg/dL
Hgb urine dipstick: NEGATIVE
KETONES UR: NEGATIVE mg/dL
Nitrite: NEGATIVE
PROTEIN: NEGATIVE mg/dL
Specific Gravity, Urine: 1.028 (ref 1.005–1.030)
pH: 5 (ref 5.0–8.0)

## 2016-01-06 LAB — WET PREP, GENITAL
Clue Cells Wet Prep HPF POC: NONE SEEN
Sperm: NONE SEEN
TRICH WET PREP: NONE SEEN
YEAST WET PREP: NONE SEEN

## 2016-01-06 LAB — CHLAMYDIA/NGC RT PCR (ARMC ONLY)
CHLAMYDIA TR: NOT DETECTED
N gonorrhoeae: NOT DETECTED

## 2016-01-06 LAB — CBC
HCT: 41.4 % (ref 35.0–47.0)
Hemoglobin: 13.9 g/dL (ref 12.0–16.0)
MCH: 31.2 pg (ref 26.0–34.0)
MCHC: 33.6 g/dL (ref 32.0–36.0)
MCV: 92.7 fL (ref 80.0–100.0)
Platelets: 222 10*3/uL (ref 150–440)
RBC: 4.47 MIL/uL (ref 3.80–5.20)
RDW: 14.1 % (ref 11.5–14.5)
WBC: 6.5 10*3/uL (ref 3.6–11.0)

## 2016-01-06 LAB — LIPASE, BLOOD: Lipase: 15 U/L (ref 11–51)

## 2016-01-06 MED ORDER — AZITHROMYCIN 250 MG PO TABS
1000.0000 mg | ORAL_TABLET | Freq: Once | ORAL | Status: AC
  Administered 2016-01-06: 1000 mg via ORAL

## 2016-01-06 MED ORDER — CEFTRIAXONE SODIUM 250 MG IJ SOLR
250.0000 mg | Freq: Once | INTRAMUSCULAR | Status: AC
  Administered 2016-01-06: 250 mg via INTRAMUSCULAR

## 2016-01-06 MED ORDER — TRAMADOL HCL 50 MG PO TABS: 100.0000 mg | ORAL_TABLET | Freq: Four times a day (QID) | ORAL | 0 refills | Status: DC | PRN

## 2016-01-06 MED ORDER — HYDROCODONE-ACETAMINOPHEN 5-325 MG PO TABS
ORAL_TABLET | ORAL | Status: AC
  Filled 2016-01-06: qty 1

## 2016-01-06 MED ORDER — HYDROCODONE-ACETAMINOPHEN 5-325 MG PO TABS
1.0000 | ORAL_TABLET | Freq: Once | ORAL | Status: AC
  Administered 2016-01-06: 1 via ORAL

## 2016-01-06 MED ORDER — AZITHROMYCIN 1 G PO PACK: 1.0000 g | PACK | Freq: Once | ORAL | Status: DC

## 2016-01-06 MED ORDER — AZITHROMYCIN 500 MG PO TABS
ORAL_TABLET | ORAL | Status: AC
  Administered 2016-01-06: 1000 mg via ORAL
  Filled 2016-01-06: qty 2

## 2016-01-06 MED ORDER — NITROFURANTOIN MONOHYD MACRO 100 MG PO CAPS: 100.0000 mg | ORAL_CAPSULE | Freq: Two times a day (BID) | ORAL | 0 refills | Status: AC

## 2016-01-06 MED ORDER — CEFTRIAXONE SODIUM 250 MG IJ SOLR
INTRAMUSCULAR | Status: AC
  Administered 2016-01-06: 250 mg via INTRAMUSCULAR
  Filled 2016-01-06: qty 250

## 2016-01-06 MED ORDER — HYDROCODONE-ACETAMINOPHEN 5-325 MG PO TABS
ORAL_TABLET | ORAL | Status: AC
  Administered 2016-01-06: 1 via ORAL
  Filled 2016-01-06: qty 1

## 2016-01-06 NOTE — ED Triage Notes (Signed)
Pt presents with pelvic pain x4 days. Pt with hx of cysts on ovaries.

## 2016-01-06 NOTE — ED Notes (Signed)
Discharge instructions reviewed with patient. Questions fielded by this RN. Patient verbalizes understanding of instructions. Patient discharged home in stable condition per Robinson MD . No acute distress noted at time of discharge.   

## 2016-01-06 NOTE — ED Provider Notes (Signed)
[facility] Emergency Department Provider Note    First MD Initiated Contact with Patient 01/06/16 1843     (approximate)  I have reviewed the triage vital signs and the nursing notes.   HISTORY  Chief Complaint Pelvic Pain    HPI Andrea Kim is a 45 y.o. female who presents with 4 days of bilateral lower abdomen and pelvic pain. Patient says the pain is constant. No fevers. Has had increased frequency and change in urine color but no dysuria. No flank pain. No vaginal discharge or bleeding. Patient is sexually active. States that she has been pregnant as she has a history as a partial hysterectomy. States she's had similar pain in the past was diagnosed with ovarian cysts. Has not tried any over-the-counter medications at home.   Past Medical History:  Diagnosis Date  . Asthma   . Bronchitis   . Edema   . Hypertension    Family History  Problem Relation Age of Onset  . Prostate cancer Father    Past Surgical History:  Procedure Laterality Date  . ABDOMINAL HYSTERECTOMY    . BACK SURGERY  05/2013  . etopic pregnancy     Patient Active Problem List   Diagnosis Date Noted  . Asthma with exacerbation 12/15/2014  . Depression, major, single episode, severe (HCC) 10/02/2014  . Asthma, chronic 07/17/2014  . Cough 03/20/2014  . Dyspnea 03/20/2014  . Tobacco abuse counseling 03/20/2014      Prior to Admission medications   Medication Sig Start Date End Date Taking? Authorizing Provider  albuterol (PROVENTIL HFA;VENTOLIN HFA) 108 (90 BASE) MCG/ACT inhaler Inhale 2 puffs into the lungs every 4 (four) hours as needed for wheezing or shortness of breath. 03/20/14   Vishal Mungal, MD  beclomethasone (QVAR) 40 MCG/ACT inhaler Inhale 2 puffs into the lungs 2 (two) times daily. 12/15/14   Vishal Mungal, MD  cephALEXin (KEFLEX) 500 MG capsule Take 1 capsule (500 mg total) by mouth 3 (three) times daily. 05/17/15   Minna Antis, MD    Fluticasone Furoate (ARNUITY ELLIPTA) 200 MCG/ACT AEPB Inhale 1 puff into the lungs daily. 12/15/14   Vishal Mungal, MD  furosemide (LASIX) 20 MG tablet Take 1 tablet (20 mg total) by mouth daily. 05/17/15 05/16/16  Minna Antis, MD  ibuprofen (ADVIL,MOTRIN) 800 MG tablet Take 1 tablet (800 mg total) by mouth every 8 (eight) hours as needed. 01/12/15   Charmayne Sheer Beers, PA-C  mirtazapine (REMERON) 30 MG tablet Take 1 tablet (30 mg total) by mouth at bedtime. 10/02/14   Audery Amel, MD  naproxen (EC NAPROSYN) 500 MG EC tablet Take 1 tablet (500 mg total) by mouth 2 (two) times daily with a meal. 11/26/14   Jenise V Bacon Menshew, PA-C  nitrofurantoin, macrocrystal-monohydrate, (MACROBID) 100 MG capsule Take 1 capsule (100 mg total) by mouth 2 (two) times daily. 01/06/16 01/09/16  Willy Eddy, MD  oxyCODONE-acetaminophen (ROXICET) 5-325 MG tablet Take 1 tablet by mouth every 6 (six) hours as needed. 05/17/15   Minna Antis, MD  predniSONE (DELTASONE) 5 MG tablet Take 6 tablets daily for 5 days 12/15/14   Stephanie Acre, MD  ranitidine (ZANTAC) 300 MG tablet Take 1 tablet (300 mg total) by mouth at bedtime. 07/17/14 07/17/15  Vishal Mungal, MD  traMADol (ULTRAM) 50 MG tablet Take 2 tablets (100 mg total) by mouth every 6 (six) hours as needed. 01/06/16 01/05/17  Willy Eddy, MD    Allergies Patient has no known allergies.  Social History Social History  Substance Use Topics  . Smoking status: Current Every Day Smoker    Packs/day: 0.50    Years: 30.00    Types: Cigarettes  . Smokeless tobacco: Never Used  . Alcohol use No    Review of Systems Patient denies headaches, rhinorrhea, blurry vision, numbness, shortness of breath, chest pain, edema, cough, abdominal pain, nausea, vomiting, diarrhea, dysuria, fevers, rashes or hallucinations unless otherwise stated above in HPI. ____________________________________________   PHYSICAL EXAM:  VITAL SIGNS: Vitals:   01/06/16  2031 01/06/16 2135  BP: (!) 139/95 (!) 142/99  Pulse: 94 (!) 104  Resp: 18 18  Temp:      Constitutional: Alert and oriented. Well appearing and in no acute distress. Eyes: Conjunctivae are normal. PERRL. EOMI. Head: Atraumatic. Nose: No congestion/rhinnorhea. Mouth/Throat: Mucous membranes are moist.  Oropharynx non-erythematous. Neck: No stridor. Painless ROM. No cervical spine tenderness to palpation Hematological/Lymphatic/Immunilogical: No cervical lymphadenopathy. Cardiovascular: Normal rate, regular rhythm. Grossly normal heart sounds.  Good peripheral circulation. Respiratory: Normal respiratory effort.  No retractions. Lungs CTAB. Gastrointestinal: Soft and nontender. No distention. No abdominal bruits. No CVA tenderness. Genitourinary: pelvic exam performed with scant purulent discharge and mid vaginal ttp. No adnexal ttp. Musculoskeletal: No lower extremity tenderness nor edema.  No joint effusions. Neurologic:  Normal speech and language. No gross focal neurologic deficits are appreciated. No gait instability. Skin:  Skin is warm, dry and intact. No rash noted. Psychiatric: Mood and affect are normal. Speech and behavior are normal.  ____________________________________________   LABS (all labs ordered are listed, but only abnormal results are displayed)  Results for orders placed or performed during the hospital encounter of 01/06/16 (from the past 24 hour(s))  Lipase, blood     Status: None   Collection Time: 01/06/16  4:53 PM  Result Value Ref Range   Lipase 15 11 - 51 U/L  Comprehensive metabolic panel     Status: Abnormal   Collection Time: 01/06/16  4:53 PM  Result Value Ref Range   Sodium 139 135 - 145 mmol/L   Potassium 4.2 3.5 - 5.1 mmol/L   Chloride 106 101 - 111 mmol/L   CO2 26 22 - 32 mmol/L   Glucose, Bld 92 65 - 99 mg/dL   BUN 14 6 - 20 mg/dL   Creatinine, Ser 9.601.14 (H) 0.44 - 1.00 mg/dL   Calcium 9.0 8.9 - 45.410.3 mg/dL   Total Protein 6.9 6.5 - 8.1  g/dL   Albumin 4.0 3.5 - 5.0 g/dL   AST 63 (H) 15 - 41 U/L   ALT 63 (H) 14 - 54 U/L   Alkaline Phosphatase 66 38 - 126 U/L   Total Bilirubin 0.6 0.3 - 1.2 mg/dL   GFR calc non Af Amer 56 (L) >60 mL/min   GFR calc Af Amer >60 >60 mL/min   Anion gap 7 5 - 15  CBC     Status: None   Collection Time: 01/06/16  4:53 PM  Result Value Ref Range   WBC 6.5 3.6 - 11.0 K/uL   RBC 4.47 3.80 - 5.20 MIL/uL   Hemoglobin 13.9 12.0 - 16.0 g/dL   HCT 09.841.4 11.935.0 - 14.747.0 %   MCV 92.7 80.0 - 100.0 fL   MCH 31.2 26.0 - 34.0 pg   MCHC 33.6 32.0 - 36.0 g/dL   RDW 82.914.1 56.211.5 - 13.014.5 %   Platelets 222 150 - 440 K/uL  Urinalysis, Complete w Microscopic     Status: Abnormal  Collection Time: 01/06/16  4:53 PM  Result Value Ref Range   Color, Urine YELLOW (A) YELLOW   APPearance HAZY (A) CLEAR   Specific Gravity, Urine 1.028 1.005 - 1.030   pH 5.0 5.0 - 8.0   Glucose, UA NEGATIVE NEGATIVE mg/dL   Hgb urine dipstick NEGATIVE NEGATIVE   Bilirubin Urine NEGATIVE NEGATIVE   Ketones, ur NEGATIVE NEGATIVE mg/dL   Protein, ur NEGATIVE NEGATIVE mg/dL   Nitrite NEGATIVE NEGATIVE   Leukocytes, UA MODERATE (A) NEGATIVE   RBC / HPF 0-5 0 - 5 RBC/hpf   WBC, UA 6-30 0 - 5 WBC/hpf   Bacteria, UA RARE (A) NONE SEEN   Squamous Epithelial / LPF 6-30 (A) NONE SEEN   Mucous PRESENT   Wet prep, genital     Status: Abnormal   Collection Time: 01/06/16  8:43 PM  Result Value Ref Range   Yeast Wet Prep HPF POC NONE SEEN NONE SEEN   Trich, Wet Prep NONE SEEN NONE SEEN   Clue Cells Wet Prep HPF POC NONE SEEN NONE SEEN   WBC, Wet Prep HPF POC FEW (A) NONE SEEN   Sperm NONE SEEN    ____________________________________________  ____________________________________________  RADIOLOGY  I personally reviewed all radiographic images ordered to evaluate for the above acute complaints and reviewed radiology reports and findings.  These findings were personally discussed with the patient.  Please see medical record for radiology  report.  ____________________________________________   PROCEDURES  Procedure(s) performed: none Procedures    Critical Care performed: no ____________________________________________   INITIAL IMPRESSION / ASSESSMENT AND PLAN / ED COURSE  Pertinent labs & imaging results that were available during my care of the patient were reviewed by me and considered in my medical decision making (see chart for details).  DDX: uti, pid, toa, torsion, cyst, colitis  Andrea Kim is a 45 y.o. who presents to the ED with 4 days of pelvic and lower abdominal pain. Patient is afebrile hemodynamically stable. Her abdominal exam is otherwise reassuring. Urine does have mild features of UTI. Blood is otherwise reassuring. Based on her history of ovarian cysts will order ultrasound evaluate for any acute complication. Will perform pelvic exam to evaluate for any evidence of acute infection  Clinical Course as of Jan 06 2147  Wed Jan 06, 2016  2100 [place] reassuring.  Patient with central ttp on pelvic exam with purulent discharge.  Will treat for PID.  [PR]  2136 Patient was able to tolerate PO and was able to ambulate with a steady gait.  Wet prep negative.  Will also cover for uti with macrobid.  Have discussed with the patient and available family all diagnostics and treatments performed thus far and all questions were answered to the best of my ability. The patient demonstrates understanding and agreement with plan.   [PR]    Clinical Course User Index [PR] Willy Eddy, MD     ____________________________________________   FINAL CLINICAL IMPRESSION(S) / ED DIAGNOSES  Final diagnoses:  Pelvic pain in female  Acute cystitis without hematuria      NEW MEDICATIONS STARTED DURING THIS VISIT:  Discharge Medication List as of 01/06/2016  9:24 PM    START taking these medications   Details  nitrofurantoin, macrocrystal-monohydrate, (MACROBID) 100 MG capsule Take 1 capsule  (100 mg total) by mouth 2 (two) times daily., Starting Wed 01/06/2016, Until Sat 01/09/2016, Print    traMADol (ULTRAM) 50 MG tablet Take 2 tablets (100 mg total) by mouth every 6 (six)  hours as needed., Starting Wed 01/06/2016, Until Thu 01/05/2017, Print         Note:  This document was prepared using Dragon voice recognition software and may include unintentional dictation errors.    Willy EddyPatrick Rashan Patient, MD 01/06/16 2149

## 2016-01-06 NOTE — ED Notes (Signed)
Pt states lower abd cramping for 4 days, states difficulty urinating. Pt awake and alert in no acute distress

## 2016-01-06 NOTE — ED Notes (Signed)
Patient transported to Ultrasound 

## 2016-06-01 ENCOUNTER — Emergency Department: Payer: Self-pay

## 2016-06-01 ENCOUNTER — Encounter: Payer: Self-pay | Admitting: Emergency Medicine

## 2016-06-01 ENCOUNTER — Emergency Department
Admission: EM | Admit: 2016-06-01 | Discharge: 2016-06-01 | Disposition: A | Discharge status: Home / Self Care | Payer: Self-pay | Attending: Emergency Medicine | Admitting: Emergency Medicine

## 2016-06-01 DIAGNOSIS — I10 Essential (primary) hypertension: Secondary | ICD-10-CM | POA: Insufficient documentation

## 2016-06-01 DIAGNOSIS — M7582 Other shoulder lesions, left shoulder: Secondary | ICD-10-CM

## 2016-06-01 DIAGNOSIS — J45909 Unspecified asthma, uncomplicated: Secondary | ICD-10-CM | POA: Insufficient documentation

## 2016-06-01 DIAGNOSIS — Y929 Unspecified place or not applicable: Secondary | ICD-10-CM | POA: Insufficient documentation

## 2016-06-01 DIAGNOSIS — M75102 Unspecified rotator cuff tear or rupture of left shoulder, not specified as traumatic: Secondary | ICD-10-CM | POA: Insufficient documentation

## 2016-06-01 DIAGNOSIS — X500XXA Overexertion from strenuous movement or load, initial encounter: Secondary | ICD-10-CM | POA: Insufficient documentation

## 2016-06-01 DIAGNOSIS — F17210 Nicotine dependence, cigarettes, uncomplicated: Secondary | ICD-10-CM | POA: Insufficient documentation

## 2016-06-01 DIAGNOSIS — Y999 Unspecified external cause status: Secondary | ICD-10-CM | POA: Insufficient documentation

## 2016-06-01 DIAGNOSIS — Y939 Activity, unspecified: Secondary | ICD-10-CM | POA: Insufficient documentation

## 2016-06-01 DIAGNOSIS — Z79899 Other long term (current) drug therapy: Secondary | ICD-10-CM | POA: Insufficient documentation

## 2016-06-01 MED ORDER — MELOXICAM 15 MG PO TABS: 15.0000 mg | ORAL_TABLET | Freq: Every day | ORAL | 0 refills | Status: DC

## 2016-06-01 MED ORDER — KETOROLAC TROMETHAMINE 60 MG/2ML IM SOLN
60.0000 mg | Freq: Once | INTRAMUSCULAR | Status: AC
  Administered 2016-06-01: 60 mg via INTRAMUSCULAR
  Filled 2016-06-01: qty 2

## 2016-06-01 MED ORDER — TRAMADOL HCL 50 MG PO TABS: 50.0000 mg | ORAL_TABLET | Freq: Four times a day (QID) | ORAL | 0 refills | Status: DC | PRN

## 2016-06-01 NOTE — Discharge Instructions (Signed)
Wear sling for 3 to 5 days as needed. Follow-up orthopedics for definitive treatment.

## 2016-06-01 NOTE — ED Notes (Signed)
Patient transported to X-ray 

## 2016-06-01 NOTE — ED Notes (Signed)
See triage note  States she developed left shoulder pain about 3 days ago w/o unknown injury   States pain is now moving into neck area

## 2016-06-01 NOTE — ED Triage Notes (Signed)
Patient presents to the ED with left shoulder pain x 2 days, much worse with movement of left shoulder.  Patient states she was lying down and then lifted herself up and heard a "pop" in her arm.  Patient is in no obvious distress at this time.  No obvious deformity noted.

## 2016-06-01 NOTE — ED Provider Notes (Signed)
[facility] Emergency Department Provider Note   ____________________________________________   First MD Initiated Contact with Patient 06/01/16 1157     (approximate)  I have reviewed the triage vital signs and the nursing notes.   HISTORY  Chief Complaint Shoulder Pain    HPI Andrea Kim is a 46 y.o. female patient complaining left shoulder pain for 2 days. Patient stated pain increases with movement to shoulder. Patient states she was laying down and felt a "pop" in her arm as she lifted self off the bed. Patient state pain increased with adduction of the arm. Patient rates the pain as a 8/10. Patient described a pain as "achy". No palliative measures for this complaint.   Past Medical History:  Diagnosis Date  . Asthma   . Bronchitis   . Edema   . Hypertension     Patient Active Problem List   Diagnosis Date Noted  . Asthma with exacerbation 12/15/2014  . Depression, major, single episode, severe (HCC) 10/02/2014  . Asthma, chronic 07/17/2014  . Cough 03/20/2014  . Dyspnea 03/20/2014  . Tobacco abuse counseling 03/20/2014    Past Surgical History:  Procedure Laterality Date  . ABDOMINAL HYSTERECTOMY    . BACK SURGERY  05/2013  . etopic pregnancy      Prior to Admission medications   Medication Sig Start Date End Date Taking? Authorizing Provider  albuterol (PROVENTIL HFA;VENTOLIN HFA) 108 (90 BASE) MCG/ACT inhaler Inhale 2 puffs into the lungs every 4 (four) hours as needed for wheezing or shortness of breath. 03/20/14   Vishal Mungal, MD  beclomethasone (QVAR) 40 MCG/ACT inhaler Inhale 2 puffs into the lungs 2 (two) times daily. 12/15/14   Vishal Mungal, MD  cephALEXin (KEFLEX) 500 MG capsule Take 1 capsule (500 mg total) by mouth 3 (three) times daily. 05/17/15   Minna Antis, MD  Fluticasone Furoate (ARNUITY ELLIPTA) 200 MCG/ACT AEPB Inhale 1 puff into the lungs daily. 12/15/14   Vishal Mungal, MD  furosemide (LASIX)  20 MG tablet Take 1 tablet (20 mg total) by mouth daily. 05/17/15 05/16/16  Minna Antis, MD  ibuprofen (ADVIL,MOTRIN) 800 MG tablet Take 1 tablet (800 mg total) by mouth every 8 (eight) hours as needed. 01/12/15   Evangeline Dakin, PA-C  meloxicam (MOBIC) 15 MG tablet Take 1 tablet (15 mg total) by mouth daily. 06/01/16   Joni Reining, PA-C  mirtazapine (REMERON) 30 MG tablet Take 1 tablet (30 mg total) by mouth at bedtime. 10/02/14   Audery Amel, MD  naproxen (EC NAPROSYN) 500 MG EC tablet Take 1 tablet (500 mg total) by mouth 2 (two) times daily with a meal. 11/26/14   Jenise V Bacon Menshew, PA-C  oxyCODONE-acetaminophen (ROXICET) 5-325 MG tablet Take 1 tablet by mouth every 6 (six) hours as needed. 05/17/15   Minna Antis, MD  predniSONE (DELTASONE) 5 MG tablet Take 6 tablets daily for 5 days 12/15/14   Stephanie Acre, MD  ranitidine (ZANTAC) 300 MG tablet Take 1 tablet (300 mg total) by mouth at bedtime. 07/17/14 07/17/15  Vishal Mungal, MD  traMADol (ULTRAM) 50 MG tablet Take 2 tablets (100 mg total) by mouth every 6 (six) hours as needed. 01/06/16 01/05/17  Willy Eddy, MD  traMADol (ULTRAM) 50 MG tablet Take 1 tablet (50 mg total) by mouth every 6 (six) hours as needed. 06/01/16 06/01/17  Joni Reining, PA-C    Allergies Patient has no known allergies.  Family History  Problem Relation Age of Onset  .  Prostate cancer Father     Social History Social History  Substance Use Topics  . Smoking status: Current Every Day Smoker    Packs/day: 0.10    Years: 30.00    Types: Cigarettes  . Smokeless tobacco: Never Used  . Alcohol use No    Review of Systems  Constitutional: No fever/chills Eyes: No visual changes. ENT: No sore throat. Cardiovascular: Denies chest pain. Respiratory: Denies shortness of breath. Gastrointestinal: No abdominal pain.  No nausea, no vomiting.  No diarrhea.  No constipation. Genitourinary: Negative for dysuria. Musculoskeletal: Negative for back  pain. Skin: Negative for rash. Neurological: Negative for headaches, focal weakness or numbness. Endocrine:Hypertension  ____________________________________________   PHYSICAL EXAM:  VITAL SIGNS: ED Triage Vitals  Enc Vitals Group     BP 06/01/16 1201 (!) 150/78     Pulse Rate 06/01/16 1201 88     Resp 06/01/16 1201 20     Temp 06/01/16 1201 98.2 F (36.8 C)     Temp Source 06/01/16 1201 Oral     SpO2 06/01/16 1201 99 %     Weight 06/01/16 1143 252 lb (114.3 kg)     Height 06/01/16 1143  (1.651 m)     Head Circumference --      Peak Flow --      Pain Score 06/01/16 1143 8     Pain Loc --      Pain Edu? --      Excl. in GC? --     Constitutional: Alert and oriented. Well appearing and in no acute distress. Eyes: Conjunctivae are normal. PERRL. EOMI. Head: Atraumatic. Nose: No congestion/rhinnorhea. Mouth/Throat: Mucous membranes are moist.  Oropharynx non-erythematous. Neck: No stridor.  No cervical spine tenderness to palpation. Hematological/Lymphatic/Immunilogical: No cervical lymphadenopathy. Cardiovascular: Normal rate, regular rhythm. Grossly normal heart sounds.  Good peripheral circulation. Respiratory: Normal respiratory effort.  No retractions. Lungs CTAB. Gastrointestinal: Soft and nontender. No distention. No abdominal bruits. No CVA tenderness. Musculoskeletal: No obvious deformity to the left shoulder. Decreased range of motion with adduction. Strength against resistance 3/5 in comparison to right. Neurologic:  Normal speech and language. No gross focal neurologic deficits are appreciated. No gait instability. Skin:  Skin is warm, dry and intact. No rash noted. Psychiatric: Mood and affect are normal. Speech and behavior are normal.  ____________________________________________   LABS (all labs ordered are listed, but only abnormal results are displayed)  Labs Reviewed - No data to  display ____________________________________________  EKG   ____________________________________________  RADIOLOGY  Findings consistent with rotator cuff tendinitis ____________________________________________   PROCEDURES  Procedure(s) performed: None  Procedures  Critical Care performed: No  ____________________________________________   INITIAL IMPRESSION / ASSESSMENT AND PLAN / ED COURSE  Pertinent labs & imaging results that were available during my care of the patient were reviewed by me and considered in my medical decision making (see chart for details).  Left rotator cuff tendinitis. Discussed x-ray finding with patient. Patient placed in arm sling and given discharge care instructions. Patient advised follow-up orthopedics for definitive evaluation and treatment.      ____________________________________________   FINAL CLINICAL IMPRESSION(S) / ED DIAGNOSES  Final diagnoses:  Tendinitis of left rotator cuff      NEW MEDICATIONS STARTED DURING THIS VISIT:  New Prescriptions   MELOXICAM (MOBIC) 15 MG TABLET    Take 1 tablet (15 mg total) by mouth daily.   TRAMADOL (ULTRAM) 50 MG TABLET    Take 1 tablet (50 mg total) by mouth every  6 (six) hours as needed.     Note:  This document was prepared using Dragon voice recognition software and may include unintentional dictation errors.    Joni Reining, PA-C 06/01/16 1316    Governor Rooks, MD 06/01/16 [phone]

## 2016-06-28 ENCOUNTER — Encounter: Payer: Self-pay | Admitting: Emergency Medicine

## 2016-06-28 ENCOUNTER — Emergency Department
Admission: EM | Admit: 2016-06-28 | Discharge: 2016-06-28 | Disposition: A | Discharge status: Home / Self Care | Payer: Self-pay | Attending: Emergency Medicine | Admitting: Emergency Medicine

## 2016-06-28 ENCOUNTER — Emergency Department: Payer: Self-pay

## 2016-06-28 DIAGNOSIS — M542 Cervicalgia: Secondary | ICD-10-CM | POA: Insufficient documentation

## 2016-06-28 DIAGNOSIS — F17210 Nicotine dependence, cigarettes, uncomplicated: Secondary | ICD-10-CM | POA: Insufficient documentation

## 2016-06-28 DIAGNOSIS — N3000 Acute cystitis without hematuria: Secondary | ICD-10-CM

## 2016-06-28 DIAGNOSIS — R1030 Lower abdominal pain, unspecified: Secondary | ICD-10-CM

## 2016-06-28 DIAGNOSIS — Z79899 Other long term (current) drug therapy: Secondary | ICD-10-CM | POA: Insufficient documentation

## 2016-06-28 DIAGNOSIS — I10 Essential (primary) hypertension: Secondary | ICD-10-CM | POA: Insufficient documentation

## 2016-06-28 DIAGNOSIS — J45909 Unspecified asthma, uncomplicated: Secondary | ICD-10-CM | POA: Insufficient documentation

## 2016-06-28 LAB — COMPREHENSIVE METABOLIC PANEL
ALT: 24 U/L (ref 14–54)
AST: 25 U/L (ref 15–41)
Albumin: 3.9 g/dL (ref 3.5–5.0)
Alkaline Phosphatase: 78 U/L (ref 38–126)
Anion gap: 7 (ref 5–15)
BUN: 10 mg/dL (ref 6–20)
CHLORIDE: 108 mmol/L (ref 101–111)
CO2: 24 mmol/L (ref 22–32)
Calcium: 8.5 mg/dL — ABNORMAL LOW (ref 8.9–10.3)
Creatinine, Ser: 0.89 mg/dL (ref 0.44–1.00)
GFR calc Af Amer: >60 mL/min (ref >60)
GFR calc non Af Amer: >60 mL/min (ref >60)
GLUCOSE: 103 mg/dL — AB (ref 65–99)
POTASSIUM: 4.1 mmol/L (ref 3.5–5.1)
Sodium: 139 mmol/L (ref 135–145)
Total Bilirubin: 0.5 mg/dL (ref 0.3–1.2)
Total Protein: 6.9 g/dL (ref 6.5–8.1)

## 2016-06-28 LAB — CBC
HCT: 39.4 % (ref 35.0–47.0)
Hemoglobin: 13.2 g/dL (ref 12.0–16.0)
MCH: 32.3 pg (ref 26.0–34.0)
MCHC: 33.6 g/dL (ref 32.0–36.0)
MCV: 96.1 fL (ref 80.0–100.0)
Platelets: 264 10*3/uL (ref 150–440)
RBC: 4.1 MIL/uL (ref 3.80–5.20)
RDW: 14.3 % (ref 11.5–14.5)
WBC: 6 10*3/uL (ref 3.6–11.0)

## 2016-06-28 LAB — URINALYSIS, ROUTINE W REFLEX MICROSCOPIC
BILIRUBIN URINE: NEGATIVE
Glucose, UA: NEGATIVE mg/dL
KETONES UR: NEGATIVE mg/dL
Nitrite: POSITIVE — AB
PH: 5 (ref 5.0–8.0)
Protein, ur: NEGATIVE mg/dL
Specific Gravity, Urine: 1.017 (ref 1.005–1.030)

## 2016-06-28 LAB — LIPASE, BLOOD: LIPASE: 28 U/L (ref 11–51)

## 2016-06-28 LAB — POCT PREGNANCY, URINE: Preg Test, Ur: NEGATIVE

## 2016-06-28 MED ORDER — MORPHINE SULFATE (PF) 4 MG/ML IV SOLN
4.0000 mg | Freq: Once | INTRAVENOUS | Status: AC
  Administered 2016-06-28: 4 mg via INTRAVENOUS
  Filled 2016-06-28: qty 1

## 2016-06-28 MED ORDER — CEFTRIAXONE SODIUM IN DEXTROSE 20 MG/ML IV SOLN
1.0000 g | Freq: Once | INTRAVENOUS | Status: AC
  Administered 2016-06-28: 1 g via INTRAVENOUS
  Filled 2016-06-28: qty 50

## 2016-06-28 MED ORDER — CEPHALEXIN 500 MG PO CAPS: 500.0000 mg | ORAL_CAPSULE | Freq: Three times a day (TID) | ORAL | 0 refills | Status: DC

## 2016-06-28 MED ORDER — HYDROCODONE-ACETAMINOPHEN 5-325 MG PO TABS: 1.0000 | ORAL_TABLET | ORAL | 0 refills | Status: DC | PRN

## 2016-06-28 MED ORDER — KETOROLAC TROMETHAMINE 30 MG/ML IJ SOLN
30.0000 mg | Freq: Once | INTRAMUSCULAR | Status: AC
  Administered 2016-06-28: 30 mg via INTRAVENOUS
  Filled 2016-06-28: qty 1

## 2016-06-28 NOTE — ED Notes (Addendum)
Pt asking for pain meds at this time, Andrea MilletMegan RN made aware. Pt laying in bed playing on phone.

## 2016-06-28 NOTE — ED Notes (Signed)
NAD noted at time of D/C. Pt taken to the lobby by her cousin via wheelchair. Pt Denies any comments/concerns at this time. MD aware of patient's BP at time of D/C. Pt instructed to take BP medications when she gets home.

## 2016-06-28 NOTE — ED Provider Notes (Signed)
[facility] Emergency Department Provider Note  Time seen: 7:48 AM  I have reviewed the triage vital signs and the nursing notes.   HISTORY  Chief Complaint Abdominal Pain    HPI Andrea Kim is a 46 y.o. female with a past medical history of asthma, hypertension, presents to the emergency department with 2 complaints. Patient states for the past 4 days she has been experiencing left-sided neck pain that is worse when she turns her head. Denies any injury. Denies any weakness numbness or tingling. Patient's second complaint is lower abdominal pain which started around 5 PM yesterday. Patient states a history of ovarian cysts in the past which this feels identical however states it is been several years since her last cyst. Denies vaginal bleeding or discharge. Denies hematuria or dysuria. Denies nausea vomiting diarrhea or fever. Describes her lower abdominal pain as moderate dull aching pain.  Past Medical History:  Diagnosis Date  . Asthma   . Bronchitis   . Edema   . Hypertension     Patient Active Problem List   Diagnosis Date Noted  . Asthma with exacerbation 12/15/2014  . Depression, major, single episode, severe (HCC) 10/02/2014  . Asthma, chronic 07/17/2014  . Cough 03/20/2014  . Dyspnea 03/20/2014  . Tobacco abuse counseling 03/20/2014    Past Surgical History:  Procedure Laterality Date  . ABDOMINAL HYSTERECTOMY    . BACK SURGERY  05/2013  . etopic pregnancy      Prior to Admission medications   Medication Sig Start Date End Date Taking? Authorizing Provider  albuterol (PROVENTIL HFA;VENTOLIN HFA) 108 (90 BASE) MCG/ACT inhaler Inhale 2 puffs into the lungs every 4 (four) hours as needed for wheezing or shortness of breath. 03/20/14   Stephanie Acre, MD  beclomethasone (QVAR) 40 MCG/ACT inhaler Inhale 2 puffs into the lungs 2 (two) times daily. 12/15/14   Mungal, Eston Esters, MD  cephALEXin (KEFLEX) 500 MG capsule Take 1 capsule (500  mg total) by mouth 3 (three) times daily. 05/17/15   Minna Antis, MD  Fluticasone Furoate (ARNUITY ELLIPTA) 200 MCG/ACT AEPB Inhale 1 puff into the lungs daily. 12/15/14   Mungal, Eston Esters, MD  furosemide (LASIX) 20 MG tablet Take 1 tablet (20 mg total) by mouth daily. 05/17/15 05/16/16  Minna Antis, MD  ibuprofen (ADVIL,MOTRIN) 800 MG tablet Take 1 tablet (800 mg total) by mouth every 8 (eight) hours as needed. 01/12/15   Beers, Charmayne Sheer, PA-C  meloxicam (MOBIC) 15 MG tablet Take 1 tablet (15 mg total) by mouth daily. 06/01/16   Joni Reining, PA-C  mirtazapine (REMERON) 30 MG tablet Take 1 tablet (30 mg total) by mouth at bedtime. 10/02/14   Clapacs, Jackquline Denmark, MD  naproxen (EC NAPROSYN) 500 MG EC tablet Take 1 tablet (500 mg total) by mouth 2 (two) times daily with a meal. 11/26/14   Menshew, Charlesetta Ivory, PA-C  oxyCODONE-acetaminophen (ROXICET) 5-325 MG tablet Take 1 tablet by mouth every 6 (six) hours as needed. 05/17/15   Minna Antis, MD  predniSONE (DELTASONE) 5 MG tablet Take 6 tablets daily for 5 days 12/15/14   Stephanie Acre, MD  ranitidine (ZANTAC) 300 MG tablet Take 1 tablet (300 mg total) by mouth at bedtime. 07/17/14 07/17/15  Stephanie Acre, MD  traMADol (ULTRAM) 50 MG tablet Take 2 tablets (100 mg total) by mouth every 6 (six) hours as needed. 01/06/16 01/05/17  Willy Eddy, MD  traMADol (ULTRAM) 50 MG tablet Take 1 tablet (50 mg total) by mouth  every 6 (six) hours as needed. 06/01/16 06/01/17  Joni ReiningSmith, Ronald K, PA-C    No Known Allergies  Family History  Problem Relation Age of Onset  . Prostate cancer Father     Social History Social History  Substance Use Topics  . Smoking status: Current Every Day Smoker    Packs/day: 0.10    Years: 30.00    Types: Cigarettes  . Smokeless tobacco: Never Used  . Alcohol use No    Review of Systems Constitutional: Negative for fever. Eyes: Negative for visual changes. ENT: Negative for congestion Cardiovascular:  Negative for chest pain. Respiratory: Negative for shortness of breath. Gastrointestinal: Positive for lower abdominal pain. Negative for nausea vomiting or diarrhea Genitourinary: Negative for dysuria. Negative for vaginal bleeding or discharge Musculoskeletal: Negative for back pain. Skin: Negative for rash. Neurological: Negative for headache All other ROS negative  ____________________________________________   PHYSICAL EXAM:  VITAL SIGNS: ED Triage Vitals  Enc Vitals Group     BP --      Pulse --      Resp --      Temp --      Temp src --      SpO2 --      Weight 06/28/16 0739 252 lb (114.3 kg)     Height 06/28/16 0739 5\' 5"  (1.651 m)     Head Circumference --      Peak Flow --      Pain Score 06/28/16 0737 9     Pain Loc --      Pain Edu? --      Excl. in GC? --     Constitutional: Alert and oriented. Well appearing and in no distress. Eyes: Normal exam ENT   Head: Normocephalic and atraumatic   Mouth/Throat: Mucous membranes are moist. Cardiovascular: Normal rate, regular rhythm. No murmur Respiratory: Normal respiratory effort without tachypnea nor retractions. Breath sounds are clear Gastrointestinal: Soft, mild suprapubic tenderness palpation. No rebound or guarding. No distention. Musculoskeletal: Nontender with normal range of motion in all extremities. Moderate left cervical paraspinal tenderness palpation. No midline tenderness. Good range of motion. Neurologic:  Normal speech and language. No gross focal neurologic deficits  Skin:  Skin is warm, dry and intact.  Psychiatric: Mood and affect are normal.  ____________________________________________   RADIOLOGY  Ultrasound was limited due to visualization issues. That no definitive abnormality.   EKG reviewed and interpreted by myself shows normal sinus rhythm at 95 bpm, narrow QRS, normal axis, normal intervals, no concerning ST  changes. ____________________________________________   INITIAL IMPRESSION / ASSESSMENT AND PLAN / ED COURSE  Pertinent labs & imaging results that were available during my care of the patient were reviewed by me and considered in my medical decision making (see chart for details).  Patient presents to the emergency department with neck pain and lower abdominal pain. Patient's neck pain is very consistent with musculoskeletal pain/cervical strain. Tender to palpation in the left paraspinal cervical area. No midline tenderness. No neurological deficits. As far as the patient's abdominal pain she has mild suprapubic tenderness palpation although denies dysuria, hematuria. States a history of ovarian cysts in the past which this feels identical. We will check labs, urinalysis and continue to closely monitor.  Patient's labs are largely within normal limits, urinalysis pending.  Ultrasound largely normal, no definitive abnormality. Given the patient's urinary tract infection we will discharge with antibiotics and a short course of pain medication. I discussed return precautions. Patient is agreeable.  ____________________________________________  FINAL CLINICAL IMPRESSION(S) / ED DIAGNOSES  Neck pain Abdominal pain Urinary tract infection   Minna Antis, MD 06/28/16 1220

## 2016-06-28 NOTE — ED Notes (Signed)
Pt taken to US at this time

## 2016-06-28 NOTE — ED Triage Notes (Addendum)
Pt reports left sided neck pain x4 days, denies injury. Pt describes neck pain as muscle spasms, reports she's unable to turn head.  Also reports bilateral lower abdominal pain since yesterday morning, denies V/D.

## 2016-06-28 NOTE — ED Notes (Signed)
Pt visualized in NAD, resting in bed, playing on phone. Pt is alert and oriented at this time. Denies any needs, will continue to monitor for further patient needs.

## 2016-06-28 NOTE — ED Notes (Signed)
NAD noted at this time. MD notified patient requesting something per pain, no new orders received. Pt is alert and oriented, resting in bed playing on her phone. Will continue to monitor for further patient needs.

## 2016-06-30 LAB — URINE CULTURE

## 2016-10-04 ENCOUNTER — Encounter: Payer: Self-pay | Admitting: Emergency Medicine

## 2016-10-04 ENCOUNTER — Emergency Department
Admission: EM | Admit: 2016-10-04 | Discharge: 2016-10-04 | Disposition: A | Discharge status: Home / Self Care | Payer: Self-pay | Attending: Emergency Medicine | Admitting: Emergency Medicine

## 2016-10-04 DIAGNOSIS — R109 Unspecified abdominal pain: Secondary | ICD-10-CM

## 2016-10-04 DIAGNOSIS — R1030 Lower abdominal pain, unspecified: Secondary | ICD-10-CM | POA: Insufficient documentation

## 2016-10-04 DIAGNOSIS — F17210 Nicotine dependence, cigarettes, uncomplicated: Secondary | ICD-10-CM | POA: Insufficient documentation

## 2016-10-04 DIAGNOSIS — I10 Essential (primary) hypertension: Secondary | ICD-10-CM | POA: Insufficient documentation

## 2016-10-04 DIAGNOSIS — Z79899 Other long term (current) drug therapy: Secondary | ICD-10-CM | POA: Insufficient documentation

## 2016-10-04 DIAGNOSIS — J45909 Unspecified asthma, uncomplicated: Secondary | ICD-10-CM | POA: Insufficient documentation

## 2016-10-04 LAB — COMPREHENSIVE METABOLIC PANEL
ALBUMIN: 4.3 g/dL (ref 3.5–5.0)
ALK PHOS: 67 U/L (ref 38–126)
ALT: 16 U/L (ref 14–54)
AST: 19 U/L (ref 15–41)
Anion gap: 10 (ref 5–15)
BILIRUBIN TOTAL: 0.7 mg/dL (ref 0.3–1.2)
BUN: 20 mg/dL (ref 6–20)
CALCIUM: 9.3 mg/dL (ref 8.9–10.3)
CO2: 22 mmol/L (ref 22–32)
Chloride: 107 mmol/L (ref 101–111)
Creatinine, Ser: 0.9 mg/dL (ref 0.44–1.00)
GFR calc Af Amer: >60 mL/min (ref >60)
GFR calc non Af Amer: >60 mL/min (ref >60)
GLUCOSE: 107 mg/dL — AB (ref 65–99)
Potassium: 3.8 mmol/L (ref 3.5–5.1)
Sodium: 139 mmol/L (ref 135–145)
TOTAL PROTEIN: 7.6 g/dL (ref 6.5–8.1)

## 2016-10-04 LAB — CBC
HCT: 43.8 % (ref 35.0–47.0)
Hemoglobin: 14.5 g/dL (ref 12.0–16.0)
MCH: 31.7 pg (ref 26.0–34.0)
MCHC: 33 g/dL (ref 32.0–36.0)
MCV: 96 fL (ref 80.0–100.0)
Platelets: 245 10*3/uL (ref 150–440)
RBC: 4.56 MIL/uL (ref 3.80–5.20)
RDW: 14 % (ref 11.5–14.5)
WBC: 6.8 10*3/uL (ref 3.6–11.0)

## 2016-10-04 LAB — URINALYSIS, COMPLETE (UACMP) WITH MICROSCOPIC
Bilirubin Urine: NEGATIVE
GLUCOSE, UA: NEGATIVE mg/dL
Ketones, ur: NEGATIVE mg/dL
NITRITE: NEGATIVE
Protein, ur: NEGATIVE mg/dL
SPECIFIC GRAVITY, URINE: 1.026 (ref 1.005–1.030)
pH: 5 (ref 5.0–8.0)

## 2016-10-04 LAB — LIPASE, BLOOD: Lipase: 26 U/L (ref 11–51)

## 2016-10-04 MED ORDER — OXYCODONE-ACETAMINOPHEN 5-325 MG PO TABS
1.0000 | ORAL_TABLET | Freq: Once | ORAL | Status: AC
  Administered 2016-10-04: 1 via ORAL
  Filled 2016-10-04: qty 1

## 2016-10-04 NOTE — ED Provider Notes (Signed)
[facility] Provider Note  ____________________________________________   I have reviewed the triage vital signs and the nursing notes.   HISTORY  Chief Complaint Abdominal Pain    HPI Andrea Kim is a 46 y.o. female Who presents today complaining of suprapubic abdominal discomfort, which she has had off and on for over a year she states. She has had multiple ultrasounds for this. She is status post hysterectomy but still has her ovaries. She states that she has occasionally had ovarian cyst with this. Patient is on chronic narcotic pain pills for her shoulder and back. [facility] chest the patient had her last prescription for 75 such pills on the ninth. She states she ran out a few days ago and since that time in her suprapubic abdominal pain, which is always there, has worsened. She states that she did not have any fever vomiting diarrhea, this is no different from every other day when she has this pain is just less well-controlled since she is out of her pain medication, patient was in some sort of negotiation with OB/GYN in May. Patient is very unclear reporting of the dates of these interactions. However, she has not followed up with them to the extent that I can determine since then, she states she is waiting for them to call her and has been waiting for the last 3-1/2 months. The pain is sharp, nothing makes it better, is worse when she runs out of her chronic pain meds, it is suprapubic. She's had multiple different ultrasounds for this. there is no radiation there are no other associated symptoms.she denies vaginal discharge or any other symptoms. Patient states that she only takes her pain pills "as needed" and "not very often" but cannot explain why therefore they have run out one week early.  Past Medical History:  Diagnosis Date  . Asthma   . Bronchitis   . Edema   . Hypertension      Patient Active Problem List   Diagnosis Date Noted  . Asthma with exacerbation 12/15/2014  . Depression, major, single episode, severe (HCC) 10/02/2014  . Asthma, chronic 07/17/2014  . Cough 03/20/2014  . Dyspnea 03/20/2014  . Tobacco abuse counseling 03/20/2014    Past Surgical History:  Procedure Laterality Date  . ABDOMINAL HYSTERECTOMY    . BACK SURGERY  05/2013  . etopic pregnancy      Prior to Admission medications   Medication Sig Start Date End Date Taking? Authorizing Provider  albuterol (PROVENTIL HFA;VENTOLIN HFA) 108 (90 BASE) MCG/ACT inhaler Inhale 2 puffs into the lungs every 4 (four) hours as needed for wheezing or shortness of breath. 03/20/14   Stephanie AcreMungal, Vishal, MD  beclomethasone (QVAR) 40 MCG/ACT inhaler Inhale 2 puffs into the lungs 2 (two) times daily. Patient not taking: Reported on 06/28/2016 12/15/14   Stephanie AcreMungal, Vishal, MD  cephALEXin (KEFLEX) 500 MG capsule Take 1 capsule (500 mg total) by mouth 3 (three) times daily. 06/28/16   Minna AntisPaduchowski, Kevin, MD  Fluticasone Furoate (ARNUITY ELLIPTA) 200 MCG/ACT AEPB Inhale 1 puff into the lungs daily. Patient not taking: Reported on 06/28/2016 12/15/14   Stephanie AcreMungal, Vishal, MD  furosemide (LASIX) 20 MG tablet Take 1 tablet (20 mg total) by mouth daily. Patient not taking: Reported on 06/28/2016 05/17/15 05/16/16  Minna AntisPaduchowski, Kevin, MD  gabapentin (NEURONTIN) 300 MG capsule Take by mouth.    [provider]  HYDROcodone-acetaminophen (NORCO/VICODIN) 5-325 MG tablet Take 1 tablet by mouth every  4 (four) hours as needed for moderate pain. 06/28/16 06/28/17  Minna Antis, MD  ibuprofen (ADVIL,MOTRIN) 800 MG tablet Take 1 tablet (800 mg total) by mouth every 8 (eight) hours as needed. Patient not taking: Reported on 06/28/2016 01/12/15   Evangeline Dakin, PA-C  meloxicam (MOBIC) 15 MG tablet Take 1 tablet (15 mg total) by mouth daily. Patient not taking: Reported on 06/28/2016 06/01/16   Joni Reining, PA-C  mirtazapine  (REMERON) 30 MG tablet Take 1 tablet (30 mg total) by mouth at bedtime. Patient not taking: Reported on 06/28/2016 10/02/14   Clapacs, Jackquline Denmark, MD  naproxen (EC NAPROSYN) 500 MG EC tablet Take 1 tablet (500 mg total) by mouth 2 (two) times daily with a meal. Patient not taking: Reported on 06/28/2016 11/26/14   Menshew, Charlesetta Ivory, PA-C  oxyCODONE-acetaminophen (ROXICET) 5-325 MG tablet Take 1 tablet by mouth every 6 (six) hours as needed. Patient not taking: Reported on 06/28/2016 05/17/15   Minna Antis, MD  predniSONE (DELTASONE) 5 MG tablet Take 6 tablets daily for 5 days Patient not taking: Reported on 06/28/2016 12/15/14   Stephanie Acre, MD  ranitidine (ZANTAC) 300 MG tablet Take 1 tablet (300 mg total) by mouth at bedtime. Patient not taking: Reported on 06/28/2016 07/17/14 07/17/15  Stephanie Acre, MD  traMADol (ULTRAM) 50 MG tablet Take 1 tablet (50 mg total) by mouth every 6 (six) hours as needed. 06/01/16 06/01/17  Joni Reining, PA-C    Allergies Patient has no known allergies.  Family History  Problem Relation Age of Onset  . Prostate cancer Father     Social History Social History  Substance Use Topics  . Smoking status: Current Every Day Smoker    Packs/day: 0.10    Years: 30.00    Types: Cigarettes  . Smokeless tobacco: Never Used  . Alcohol use No    Review of Systems Constitutional: No fever/chills Eyes: No visual changes. ENT: No sore throat. No stiff neck no neck pain Cardiovascular: Denies chest pain. Respiratory: Denies shortness of breath. Gastrointestinal:   no vomiting.  No diarrhea.  No constipation. Genitourinary: Negative for dysuria. Musculoskeletal: Negative lower extremity swelling Skin: Negative for rash. Neurological: Negative for severe headaches, focal weakness or numbness.   ____________________________________________   PHYSICAL EXAM:  VITAL SIGNS: ED Triage Vitals [10/04/16 1812]  Enc Vitals Group     BP (!) 148/90     Pulse  Rate 100     Resp 20     Temp 98 F (36.7 C)     Temp Source Oral     SpO2 100 %     Weight 252 lb (114.3 kg)     Height      Head Circumference      Peak Flow      Pain Score 9     Pain Loc      Pain Edu?      Excl. in GC?     Constitutional: Alert and oriented. Well appearing and in no acute distress. Eyes: Conjunctivae are normal Head: Atraumatic HEENT: No congestion/rhinnorhea. Mucous membranes are moist.  Oropharynx non-erythematous Neck:   Nontender with no meningismus, no masses, no stridor Cardiovascular: Normal rate, regular rhythm. Grossly normal heart sounds.  Good peripheral circulation. Respiratory: Normal respiratory effort.  No retractions. Lungs CTAB. Abdominal: Soft and distractedly tender in the suprapubic region. No distention. No guarding no rebound Back:  There is no focal tenderness or step off.  there is no midline tenderness  there are no lesions noted. there is no CVA tenderness Musculoskeletal: No lower extremity tenderness, no upper extremity tenderness. No joint effusions, no DVT signs strong distal pulses no edema Neurologic:  Normal speech and language. No gross focal neurologic deficits are appreciated.  Skin:  Skin is warm, dry and intact. No rash noted. Psychiatric: Mood and affect are normal. Speech and behavior are normal.  ____________________________________________   LABS (all labs ordered are listed, but only abnormal results are displayed)  Labs Reviewed  COMPREHENSIVE METABOLIC PANEL - Abnormal; Notable for the following:       Result Value   Glucose, Bld 107 (*)    All other components within normal limits  URINALYSIS, COMPLETE (UACMP) WITH MICROSCOPIC - Abnormal; Notable for the following:    Color, Urine YELLOW (*)    APPearance HAZY (*)    Hgb urine dipstick SMALL (*)    Leukocytes, UA SMALL (*)    Bacteria, UA FEW (*)    Squamous Epithelial / LPF 6-30 (*)    All other components within normal limits  LIPASE, BLOOD  CBC    ____________________________________________  EKG  I personally interpreted any EKGs ordered by me or triage  ____________________________________________  RADIOLOGY  I reviewed any imaging ordered by me or triage that were performed during my shift and, if possible, patient and/or family made aware of any abnormal findings. ____________________________________________   PROCEDURES  Procedure(s) performed: None  Procedures  Critical Care performed: None  ____________________________________________   INITIAL IMPRESSION / ASSESSMENT AND PLAN / ED COURSE  Pertinent labs & imaging results that were available during my care of the patient were reviewed by me and considered in my medical decision making (see chart for details).  patient with chronic recurrent abdominal pain, who presents today with abdominal pain in the context of running out of her home narcotic pain medication. Was work reassuring exam reassuring, no evidence of acute pathology noted at this time. Certainly this could worsen but at this time I don't think emergent ultrasound or other intervention is indicated. Patient not likely to have a torsion, neither she does she is certainly not viable for pregnancy in the future. Low suspicion for appendicitis given chronic recurrent nature of the pain. I will give her several pain medication here. She is not driving. She will follow closely with her pain doctors and her OB/GYN for this chronic recurrent abdominal pain    ____________________________________________   FINAL CLINICAL IMPRESSION(S) / ED DIAGNOSES  Final diagnoses:  None      This chart was dictated using voice recognition software.  Despite best efforts to proofread,  errors can occur which can change meaning.      Jeanmarie Plant, MD 10/04/16 [phone]

## 2016-10-04 NOTE — Discharge Instructions (Signed)
Do not drive home, follow closely with her primary care doctor return to the emergency room for any new or worrisome symptoms including increased pain, fever, vomiting change in your  chronic pain or other concerning symptoms

## 2016-10-04 NOTE — ED Triage Notes (Signed)
Pt presents with lower abd pain for a few days.

## 2016-11-15 ENCOUNTER — Other Ambulatory Visit: Payer: Self-pay | Admitting: Primary Care

## 2016-11-15 DIAGNOSIS — N83292 Other ovarian cyst, left side: Secondary | ICD-10-CM

## 2016-11-22 ENCOUNTER — Ambulatory Visit: Admission: RE | Admit: 2016-11-22 | Payer: Self-pay | Source: Ambulatory Visit

## 2016-11-30 ENCOUNTER — Encounter: Payer: Self-pay | Admitting: Emergency Medicine

## 2016-11-30 DIAGNOSIS — I10 Essential (primary) hypertension: Secondary | ICD-10-CM | POA: Diagnosis not present

## 2016-11-30 DIAGNOSIS — Z79899 Other long term (current) drug therapy: Secondary | ICD-10-CM | POA: Insufficient documentation

## 2016-11-30 DIAGNOSIS — F17210 Nicotine dependence, cigarettes, uncomplicated: Secondary | ICD-10-CM | POA: Insufficient documentation

## 2016-11-30 DIAGNOSIS — J209 Acute bronchitis, unspecified: Secondary | ICD-10-CM | POA: Diagnosis not present

## 2016-11-30 DIAGNOSIS — R079 Chest pain, unspecified: Secondary | ICD-10-CM | POA: Diagnosis present

## 2016-11-30 NOTE — ED Triage Notes (Signed)
.  Patient ambulatory to triage with steady gait, without difficulty or distress noted; pt reports mid CP last several days [facility] same

## 2016-12-01 ENCOUNTER — Emergency Department
Admission: EM | Admit: 2016-12-01 | Discharge: 2016-12-01 | Disposition: A | Discharge status: Home / Self Care | Payer: BLUE CROSS/BLUE SHIELD | Attending: Emergency Medicine | Admitting: Emergency Medicine

## 2016-12-01 ENCOUNTER — Other Ambulatory Visit: Payer: Self-pay

## 2016-12-01 ENCOUNTER — Emergency Department: Payer: BLUE CROSS/BLUE SHIELD

## 2016-12-01 DIAGNOSIS — J209 Acute bronchitis, unspecified: Secondary | ICD-10-CM

## 2016-12-01 LAB — BASIC METABOLIC PANEL
Anion gap: 8 (ref 5–15)
BUN: 18 mg/dL (ref 6–20)
CALCIUM: 8.9 mg/dL (ref 8.9–10.3)
CHLORIDE: 108 mmol/L (ref 101–111)
CO2: 24 mmol/L (ref 22–32)
CREATININE: 1.02 mg/dL — AB (ref 0.44–1.00)
GFR calc non Af Amer: >60 mL/min (ref >60)
GLUCOSE: 106 mg/dL — AB (ref 65–99)
Potassium: 3.8 mmol/L (ref 3.5–5.1)
Sodium: 140 mmol/L (ref 135–145)

## 2016-12-01 LAB — CBC
HCT: 39.3 % (ref 35.0–47.0)
Hemoglobin: 13.4 g/dL (ref 12.0–16.0)
MCH: 32.2 pg (ref 26.0–34.0)
MCHC: 34 g/dL (ref 32.0–36.0)
MCV: 94.7 fL (ref 80.0–100.0)
PLATELETS: 253 10*3/uL (ref 150–440)
RBC: 4.15 MIL/uL (ref 3.80–5.20)
RDW: 14.6 % — ABNORMAL HIGH (ref 11.5–14.5)
WBC: 5.5 10*3/uL (ref 3.6–11.0)

## 2016-12-01 LAB — TROPONIN I

## 2016-12-01 MED ORDER — IPRATROPIUM-ALBUTEROL 0.5-2.5 (3) MG/3ML IN SOLN
3.0000 mL | Freq: Once | RESPIRATORY_TRACT | Status: AC
  Administered 2016-12-01: 3 mL via RESPIRATORY_TRACT
  Filled 2016-12-01: qty 3

## 2016-12-01 MED ORDER — KETOROLAC TROMETHAMINE 30 MG/ML IJ SOLN
60.0000 mg | Freq: Once | INTRAMUSCULAR | Status: AC
  Administered 2016-12-01: 60 mg via INTRAMUSCULAR
  Filled 2016-12-01: qty 2

## 2016-12-01 MED ORDER — ALBUTEROL SULFATE HFA 108 (90 BASE) MCG/ACT IN AERS: 2.0000 | INHALATION_SPRAY | Freq: Four times a day (QID) | RESPIRATORY_TRACT | 2 refills | Status: DC | PRN

## 2016-12-01 MED ORDER — KETOROLAC TROMETHAMINE 10 MG PO TABS: 10.0000 mg | ORAL_TABLET | Freq: Four times a day (QID) | ORAL | 0 refills | Status: DC | PRN

## 2016-12-01 MED ORDER — AZITHROMYCIN 500 MG PO TABS: 500.0000 mg | ORAL_TABLET | Freq: Every day | ORAL | 0 refills | Status: AC

## 2016-12-01 NOTE — ED Notes (Signed)
Registration in room at this time. This RN to d/c after registration complete.

## 2016-12-01 NOTE — ED Notes (Signed)
ED Provider at bedside. 

## 2016-12-01 NOTE — ED Notes (Signed)

## 2016-12-01 NOTE — ED Provider Notes (Signed)
[facility] Emergency Department Provider Note   First MD Initiated Contact with Patient 12/01/16 0101     (approximate)  I have reviewed the triage vital signs and the nursing notes.   HISTORY  Chief Complaint Chest Pain   HPI Andrea Kim is a 46 y.o. female with below list of chronic medical conditions presents to the emergency department with nonproductive cough congestion and right-sided chest discomfort times one week. Patient denies any fever. Patient denies any lower external pain or swelling. Patient denies any nausea or vomiting. Patient admits to half a pack per day cigarette use   Past Medical History:  Diagnosis Date  . Asthma   . Bronchitis   . Edema   . Hypertension     Patient Active Problem List   Diagnosis Date Noted  . Asthma with exacerbation 12/15/2014  . Depression, major, single episode, severe (HCC) 10/02/2014  . Asthma, chronic 07/17/2014  . Cough 03/20/2014  . Dyspnea 03/20/2014  . Tobacco abuse counseling 03/20/2014    Past Surgical History:  Procedure Laterality Date  . ABDOMINAL HYSTERECTOMY    . BACK SURGERY  05/2013  . etopic pregnancy      Prior to Admission medications   Medication Sig Start Date End Date Taking? Authorizing Provider  albuterol (PROVENTIL HFA;VENTOLIN HFA) 108 (90 BASE) MCG/ACT inhaler Inhale 2 puffs into the lungs every 4 (four) hours as needed for wheezing or shortness of breath. 03/20/14   Stephanie Acre, MD  beclomethasone (QVAR) 40 MCG/ACT inhaler Inhale 2 puffs into the lungs 2 (two) times daily. Patient not taking: Reported on 06/28/2016 12/15/14   Stephanie Acre, MD  cephALEXin (KEFLEX) 500 MG capsule Take 1 capsule (500 mg total) by mouth 3 (three) times daily. 06/28/16   Minna Antis, MD  Fluticasone Furoate (ARNUITY ELLIPTA) 200 MCG/ACT AEPB Inhale 1 puff into the lungs daily. Patient not taking: Reported on 06/28/2016 12/15/14   Stephanie Acre, MD  furosemide (LASIX)  20 MG tablet Take 1 tablet (20 mg total) by mouth daily. Patient not taking: Reported on 06/28/2016 05/17/15 05/16/16  Minna Antis, MD  gabapentin (NEURONTIN) 300 MG capsule Take by mouth.    [provider]  HYDROcodone-acetaminophen (NORCO/VICODIN) 5-325 MG tablet Take 1 tablet by mouth every 4 (four) hours as needed for moderate pain. 06/28/16 06/28/17  Minna Antis, MD  ibuprofen (ADVIL,MOTRIN) 800 MG tablet Take 1 tablet (800 mg total) by mouth every 8 (eight) hours as needed. Patient not taking: Reported on 06/28/2016 01/12/15   Evangeline Dakin, PA-C  meloxicam (MOBIC) 15 MG tablet Take 1 tablet (15 mg total) by mouth daily. Patient not taking: Reported on 06/28/2016 06/01/16   Joni Reining, PA-C  mirtazapine (REMERON) 30 MG tablet Take 1 tablet (30 mg total) by mouth at bedtime. Patient not taking: Reported on 06/28/2016 10/02/14   Clapacs, Jackquline Denmark, MD  naproxen (EC NAPROSYN) 500 MG EC tablet Take 1 tablet (500 mg total) by mouth 2 (two) times daily with a meal. Patient not taking: Reported on 06/28/2016 11/26/14   Menshew, Charlesetta Ivory, PA-C  oxyCODONE-acetaminophen (ROXICET) 5-325 MG tablet Take 1 tablet by mouth every 6 (six) hours as needed. Patient not taking: Reported on 06/28/2016 05/17/15   Minna Antis, MD  predniSONE (DELTASONE) 5 MG tablet Take 6 tablets daily for 5 days Patient not taking: Reported on 06/28/2016 12/15/14   Stephanie Acre, MD  ranitidine (ZANTAC) 300 MG tablet Take 1 tablet (300 mg total) by mouth at  bedtime. Patient not taking: Reported on 06/28/2016 07/17/14 07/17/15  Stephanie Acre, MD  traMADol (ULTRAM) 50 MG tablet Take 1 tablet (50 mg total) by mouth every 6 (six) hours as needed. 06/01/16 06/01/17  Joni Reining, PA-C    Allergies No known drug allergies  Family History  Problem Relation Age of Onset  . Prostate cancer Father     Social History Social History  Substance Use Topics  . Smoking status: Current Every Day Smoker     Packs/day: 0.10    Years: 30.00    Types: Cigarettes  . Smokeless tobacco: Never Used  . Alcohol use No    Review of Systems Constitutional: No fever/chills Eyes: No visual changes. ENT: No sore throat. Cardiovascular: Positive for right sided chest pain. Respiratory: Denies shortness of breath. Positive for cough Gastrointestinal: No abdominal pain.  No nausea, no vomiting.  No diarrhea.  No constipation. Genitourinary: Negative for dysuria. Musculoskeletal: Negative for neck pain.  Negative for back pain. Integumentary: Negative for rash. Neurological: Negative for headaches, focal weakness or numbness.   ____________________________________________   PHYSICAL EXAM:  VITAL SIGNS: ED Triage Vitals  Enc Vitals Group     BP 11/30/16 2357 136/89     Pulse Rate 11/30/16 2357 90     Resp 11/30/16 2357 18     Temp 11/30/16 2357 98 F (36.7 C)     Temp Source 11/30/16 2357 Oral     SpO2 11/30/16 2357 98 %     Weight 11/30/16 2354 109.3 kg (241 lb)     Height 11/30/16 2354 1.651 m (5\' 5" )     Head Circumference --      Peak Flow --      Pain Score 11/30/16 2352 8     Pain Loc --      Pain Edu? --      Excl. in GC? --     Constitutional: Alert and oriented. Well appearing and in no acute distress. Eyes: Conjunctivae are normal.  Head: Atraumatic. Mouth/Throat: Mucous membranes are moist.  Oropharynx non-erythematous. Neck: No stridor.   Cardiovascular: Normal rate, regular rhythm. Good peripheral circulation. Grossly normal heart sounds. Respiratory: Normal respiratory effort.  No retractions. Lungs CTAB. Gastrointestinal: Soft and nontender. No distention.  Musculoskeletal: No lower extremity tenderness nor edema. No gross deformities of extremities. Neurologic:  Normal speech and language. No gross focal neurologic deficits are appreciated.  Skin:  Skin is warm, dry and intact. No rash noted. Psychiatric: Mood and affect are normal. Speech and behavior are  normal.  ____________________________________________   LABS (all labs ordered are listed, but only abnormal results are displayed)  Labs Reviewed  BASIC METABOLIC PANEL - Abnormal; Notable for the following:       Result Value   Glucose, Bld 106 (*)    Creatinine, Ser 1.02 (*)    All other components within normal limits  CBC - Abnormal; Notable for the following:    RDW 14.6 (*)    All other components within normal limits  TROPONIN I   ____________________________________________  EKG  ED ECG REPORT I, Adair N Chayna Surratt, the attending physician, personally viewed and interpreted this ECG.   Date: 12/01/2016  EKG Time: 11:57 PM  Rate: 93  Rhythm: Normal sinus rhythm with premature ventricular contractions  Axis: Normal  Intervals: Normal  ST&T Change: None  ____________________________________________  RADIOLOGY I, [place] N Frederic Tones, personally viewed and evaluated these images (plain radiographs) as part of my medical decision making, as well as  reviewing the written report by the radiologist.  Dg Chest 2 View  Result Date: 12/01/2016 CLINICAL DATA:  Chest pain.  Shortness of breath. EXAM: CHEST  2 VIEW COMPARISON:  Radiograph 05/17/2015 FINDINGS: Stable cardiomegaly and mediastinal contours. Slight decreased vascular congestion from prior. No pulmonary edema, consolidation, pleural fluid or pneumothorax. No acute osseous abnormalities. IMPRESSION: Stable cardiac.  No acute abnormality. Electronically Signed   By: Rubye OaksMelanie  Ehinger M.D.   On: 12/01/2016 00:33      Procedures   ____________________________________________   INITIAL IMPRESSION / ASSESSMENT AND PLAN / ED COURSE  As part of my medical decision making, I reviewed the following data within the electronic MEDICAL RECORD NUMBER 46 year old female presenting to the emergency department above stated history of chest pain and cough. History physical exam concerning for possible bronchitis versus pneumonia.  Consider the possibility of cardiac etiology however EKG revealed no evidence of ST segment elevation or depression. Laboratory data unremarkable including troponin. Chest x-ray revealed no acute abnormality. Suspect bronchitis as etiology for the patient's symptoms and a such patient given a DuoNeb breathing treatment in the emergency department as well as azithromycin. Patient will be prescribed azithromycin and albuterol for home.    ____________________________________________  FINAL CLINICAL IMPRESSION(S) / ED DIAGNOSES  Final diagnoses:  Acute bronchitis, unspecified organism     MEDICATIONS GIVEN DURING THIS VISIT:  Medications  ipratropium-albuterol (DUONEB) 0.5-2.5 (3) MG/3ML nebulizer solution 3 mL (3 mLs Nebulization Given 12/01/16 0112)  ketorolac (TORADOL) 30 MG/ML injection 60 mg (60 mg Intramuscular Given 12/01/16 0127)     NEW OUTPATIENT MEDICATIONS STARTED DURING THIS VISIT:  New Prescriptions   No medications on file    Modified Medications   No medications on file    Discontinued Medications   No medications on file     Note:  This document was prepared using Dragon voice recognition software and may include unintentional dictation errors.    Darci CurrentBrown, [place] N, MD 12/01/16 [phone]

## 2016-12-12 ENCOUNTER — Other Ambulatory Visit: Payer: Self-pay | Admitting: Obstetrics and Gynecology

## 2016-12-12 DIAGNOSIS — R102 Pelvic and perineal pain: Secondary | ICD-10-CM

## 2016-12-19 ENCOUNTER — Ambulatory Visit
Admission: RE | Admit: 2016-12-19 | Discharge: 2016-12-19 | Disposition: A | Discharge status: Home / Self Care | Payer: BLUE CROSS/BLUE SHIELD | Source: Ambulatory Visit | Attending: Obstetrics and Gynecology | Admitting: Obstetrics and Gynecology

## 2017-01-02 ENCOUNTER — Ambulatory Visit
Admission: RE | Admit: 2017-01-02 | Discharge: 2017-01-02 | Disposition: A | Discharge status: Home / Self Care | Payer: BLUE CROSS/BLUE SHIELD | Source: Ambulatory Visit | Attending: Obstetrics and Gynecology | Admitting: Obstetrics and Gynecology

## 2017-01-02 DIAGNOSIS — I313 Pericardial effusion (noninflammatory): Secondary | ICD-10-CM | POA: Diagnosis not present

## 2017-01-02 DIAGNOSIS — R102 Pelvic and perineal pain: Secondary | ICD-10-CM | POA: Insufficient documentation

## 2017-01-02 DIAGNOSIS — Z90710 Acquired absence of both cervix and uterus: Secondary | ICD-10-CM | POA: Insufficient documentation

## 2017-01-02 DIAGNOSIS — I700 Atherosclerosis of aorta: Secondary | ICD-10-CM | POA: Diagnosis not present

## 2017-01-02 MED ORDER — IOPAMIDOL (ISOVUE-300) INJECTION 61%
100.0000 mL | Freq: Once | INTRAVENOUS | Status: AC | PRN
  Administered 2017-01-02: 100 mL via INTRAVENOUS

## 2017-03-02 ENCOUNTER — Emergency Department: Payer: BLUE CROSS/BLUE SHIELD

## 2017-03-02 ENCOUNTER — Encounter: Payer: Self-pay | Admitting: Emergency Medicine

## 2017-03-02 ENCOUNTER — Emergency Department
Admission: EM | Admit: 2017-03-02 | Discharge: 2017-03-02 | Disposition: A | Discharge status: Home / Self Care | Payer: BLUE CROSS/BLUE SHIELD | Attending: Student in an Organized Health Care Education/Training Program | Admitting: Student in an Organized Health Care Education/Training Program

## 2017-03-02 ENCOUNTER — Other Ambulatory Visit: Payer: Self-pay

## 2017-03-02 DIAGNOSIS — F17210 Nicotine dependence, cigarettes, uncomplicated: Secondary | ICD-10-CM | POA: Insufficient documentation

## 2017-03-02 DIAGNOSIS — I10 Essential (primary) hypertension: Secondary | ICD-10-CM | POA: Insufficient documentation

## 2017-03-02 DIAGNOSIS — W230XXA Caught, crushed, jammed, or pinched between moving objects, initial encounter: Secondary | ICD-10-CM | POA: Insufficient documentation

## 2017-03-02 DIAGNOSIS — S6992XA Unspecified injury of left wrist, hand and finger(s), initial encounter: Secondary | ICD-10-CM | POA: Diagnosis present

## 2017-03-02 DIAGNOSIS — Z79899 Other long term (current) drug therapy: Secondary | ICD-10-CM | POA: Insufficient documentation

## 2017-03-02 DIAGNOSIS — S60052A Contusion of left little finger without damage to nail, initial encounter: Secondary | ICD-10-CM

## 2017-03-02 DIAGNOSIS — Y939 Activity, unspecified: Secondary | ICD-10-CM | POA: Insufficient documentation

## 2017-03-02 DIAGNOSIS — Y929 Unspecified place or not applicable: Secondary | ICD-10-CM | POA: Insufficient documentation

## 2017-03-02 DIAGNOSIS — S60042A Contusion of left ring finger without damage to nail, initial encounter: Secondary | ICD-10-CM | POA: Diagnosis not present

## 2017-03-02 DIAGNOSIS — J45909 Unspecified asthma, uncomplicated: Secondary | ICD-10-CM | POA: Insufficient documentation

## 2017-03-02 DIAGNOSIS — Y990 Civilian activity done for income or pay: Secondary | ICD-10-CM | POA: Insufficient documentation

## 2017-03-02 MED ORDER — IBUPROFEN 600 MG PO TABS: 600.0000 mg | ORAL_TABLET | Freq: Three times a day (TID) | ORAL | 0 refills | Status: DC | PRN

## 2017-03-02 MED ORDER — OXYCODONE-ACETAMINOPHEN 5-325 MG PO TABS
1.0000 | ORAL_TABLET | Freq: Once | ORAL | Status: AC
Start: 2017-03-02 — End: 2017-03-02
  Administered 2017-03-02: 1 via ORAL
  Filled 2017-03-02: qty 1

## 2017-03-02 MED ORDER — IBUPROFEN 600 MG PO TABS
ORAL_TABLET | ORAL | Status: AC
  Administered 2017-03-02: 600 mg via ORAL
  Filled 2017-03-02: qty 1

## 2017-03-02 MED ORDER — IBUPROFEN 600 MG PO TABS
600.0000 mg | ORAL_TABLET | Freq: Once | ORAL | Status: AC
  Administered 2017-03-02: 600 mg via ORAL
  Filled 2017-03-02: qty 1

## 2017-03-02 MED ORDER — TRAMADOL HCL 50 MG PO TABS: 50.0000 mg | ORAL_TABLET | Freq: Four times a day (QID) | ORAL | 0 refills | Status: DC | PRN

## 2017-03-02 NOTE — ED Triage Notes (Addendum)
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

## 2017-03-02 NOTE — ED Notes (Signed)
Reviewed discharge instructions, follow-up care, and prescriptions with patient. Patient verbalized understanding of all information reviewed. Patient stable, with no distress noted at this time.    

## 2017-03-02 NOTE — ED Provider Notes (Signed)
[facility] Emergency Department Provider Note   ____________________________________________   First MD Initiated Contact with Patient 03/02/17 2215     (approximate)  I have reviewed the triage vital signs and the nursing notes.   HISTORY  Chief Complaint Hand Injury    HPI Andrea Kim is a 47 y.o. female patient complaining of left hand pain confined to the fourth and fifth digit secondary to contusion by machine at work.  Patient rates pain as a 9/10.  No palliative measures for this complaint.  Described the pain is "aching".  Patient is right-hand dominant.  Past Medical History:  Diagnosis Date  . Asthma   . Bronchitis   . Edema   . Hypertension     Patient Active Problem List   Diagnosis Date Noted  . Asthma with exacerbation 12/15/2014  . Depression, major, single episode, severe (HCC) 10/02/2014  . Asthma, chronic 07/17/2014  . Cough 03/20/2014  . Dyspnea 03/20/2014  . Tobacco abuse counseling 03/20/2014    Past Surgical History:  Procedure Laterality Date  . ABDOMINAL HYSTERECTOMY    . BACK SURGERY  05/2013  . etopic pregnancy      Prior to Admission medications   Medication Sig Start Date End Date Taking? Authorizing Provider  albuterol (PROVENTIL HFA;VENTOLIN HFA) 108 (90 BASE) MCG/ACT inhaler Inhale 2 puffs into the lungs every 4 (four) hours as needed for wheezing or shortness of breath. 03/20/14   Mungal, Eston EstersVishal, MD  albuterol (PROVENTIL HFA;VENTOLIN HFA) 108 (90 Base) MCG/ACT inhaler Inhale 2 puffs into the lungs every 6 (six) hours as needed for wheezing or shortness of breath. 12/01/16   Darci CurrentBrown, [place] N, MD  beclomethasone (QVAR) 40 MCG/ACT inhaler Inhale 2 puffs into the lungs 2 (two) times daily. Patient not taking: Reported on 06/28/2016 12/15/14   Stephanie AcreMungal, Vishal, MD  cephALEXin (KEFLEX) 500 MG capsule Take 1 capsule (500 mg total) by mouth 3 (three) times daily. 06/28/16   Minna AntisPaduchowski, Kevin, MD    Fluticasone Furoate (ARNUITY ELLIPTA) 200 MCG/ACT AEPB Inhale 1 puff into the lungs daily. Patient not taking: Reported on 06/28/2016 12/15/14   Stephanie AcreMungal, Vishal, MD  furosemide (LASIX) 20 MG tablet Take 1 tablet (20 mg total) by mouth daily. Patient not taking: Reported on 06/28/2016 05/17/15 05/16/16  Minna AntisPaduchowski, Kevin, MD  gabapentin (NEURONTIN) 300 MG capsule Take by mouth.    [provider]  HYDROcodone-acetaminophen (NORCO/VICODIN) 5-325 MG tablet Take 1 tablet by mouth every 4 (four) hours as needed for moderate pain. 06/28/16 06/28/17  Minna AntisPaduchowski, Kevin, MD  ibuprofen (ADVIL,MOTRIN) 600 MG tablet Take 1 tablet (600 mg total) by mouth every 8 (eight) hours as needed. 03/02/17   Joni ReiningSmith, Khamauri Bauernfeind K, PA-C  ibuprofen (ADVIL,MOTRIN) 800 MG tablet Take 1 tablet (800 mg total) by mouth every 8 (eight) hours as needed. Patient not taking: Reported on 06/28/2016 01/12/15   Evangeline DakinBeers, Charles M, PA-C  ketorolac (TORADOL) 10 MG tablet Take 1 tablet (10 mg total) by mouth every 6 (six) hours as needed. 12/01/16   Darci CurrentBrown, [place] N, MD  meloxicam (MOBIC) 15 MG tablet Take 1 tablet (15 mg total) by mouth daily. Patient not taking: Reported on 06/28/2016 06/01/16   Joni ReiningSmith, [place]annah Morford K, PA-C  mirtazapine (REMERON) 30 MG tablet Take 1 tablet (30 mg total) by mouth at bedtime. Patient not taking: Reported on 06/28/2016 10/02/14   Clapacs, Jackquline DenmarkJohn T, MD  naproxen (EC NAPROSYN) 500 MG EC tablet Take 1 tablet (500 mg total) by mouth 2 (two) times  daily with a meal. Patient not taking: Reported on 06/28/2016 11/26/14   Menshew, Charlesetta Ivory, PA-C  oxyCODONE-acetaminophen (ROXICET) 5-325 MG tablet Take 1 tablet by mouth every 6 (six) hours as needed. Patient not taking: Reported on 06/28/2016 05/17/15   Minna Antis, MD  predniSONE (DELTASONE) 5 MG tablet Take 6 tablets daily for 5 days Patient not taking: Reported on 06/28/2016 12/15/14   Stephanie Acre, MD  ranitidine (ZANTAC) 300 MG tablet Take 1 tablet (300 mg total) by  mouth at bedtime. Patient not taking: Reported on 06/28/2016 07/17/14 07/17/15  Stephanie Acre, MD  traMADol (ULTRAM) 50 MG tablet Take 1 tablet (50 mg total) by mouth every 6 (six) hours as needed. 06/01/16 06/01/17  Joni Reining, PA-C  traMADol (ULTRAM) 50 MG tablet Take 1 tablet (50 mg total) by mouth every 6 (six) hours as needed. 03/02/17 03/02/18  Joni Reining, PA-C    Allergies Patient has no known allergies.  Family History  Problem Relation Age of Onset  . Prostate cancer Father     Social History Social History   Tobacco Use  . Smoking status: Current Every Day Smoker    Packs/day: 0.10    Years: 30.00    Pack years: 3.00    Types: Cigarettes  . Smokeless tobacco: Never Used  Substance Use Topics  . Alcohol use: No    Alcohol/week: 0.0 oz  . Drug use: No    Review of Systems Constitutional: No fever/chills Eyes: No visual changes. ENT: No sore throat. Cardiovascular: Denies chest pain. Respiratory: Denies shortness of breath. Gastrointestinal: No abdominal pain.  No nausea, no vomiting.  No diarrhea.  No constipation. Genitourinary: Negative for dysuria. Musculoskeletal: Pain to the fourth fifth digit left hand Skin: Negative for rash. Neurological: Negative for headaches, focal weakness or numbness. Endocrine:Hypertension ____________________________________________   PHYSICAL EXAM:  VITAL SIGNS: ED Triage Vitals  Enc Vitals Group     BP 03/02/17 2158 (!) 154/97     Pulse Rate 03/02/17 2158 (!) 103     Resp 03/02/17 2158 18     Temp 03/02/17 2158 98.1 F (36.7 C)     Temp Source 03/02/17 2158 Oral     SpO2 03/02/17 2158 99 %     Weight 03/02/17 2159 241 lb (109.3 kg)     Height 03/02/17 2159 5\' 5"  (1.651 m)     Head Circumference --      Peak Flow --      Pain Score 03/02/17 2203 9     Pain Loc --      Pain Edu? --      Excl. in GC? --    Constitutional: Alert and oriented. Well appearing and in no acute distress. Cardiovascular: Normal  rate, regular rhythm. Grossly normal heart sounds.  Good peripheral circulation.  Elevated blood pressure Respiratory: Normal respiratory effort.  No retractions. Lungs CTAB. Musculoskeletal: No obvious deformity to the fourth and fifth digit of the left hand.  Decreased range of motion noted by complain of pain. Neurologic:  Normal speech and language. No gross focal neurologic deficits are appreciated. No gait instability. Skin:  Skin is warm, dry and intact. No rash noted. Psychiatric: Mood and affect are normal. Speech and behavior are normal.  ____________________________________________   LABS (all labs ordered are listed, but only abnormal results are displayed)  Labs Reviewed - No data to display ____________________________________________  EKG   ____________________________________________  RADIOLOGY  ED MD interpretation: No acute findings on x-ray of the  left hand. Official radiology report(s): Dg Hand Complete Left  Result Date: 03/02/2017 CLINICAL DATA:  Injury at work, pain. EXAM: LEFT HAND - COMPLETE 3+ VIEW COMPARISON:  None. FINDINGS: Bone mineralization is normal. No fracture line or displaced fracture fragment identified. Soft tissues about the left hand are unremarkable. 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 Associated mild radial subluxation of the first metacarpal bone. IMPRESSION: 1. No acute findings.  No fracture. 2. DJD at the first [facility] joint, as detailed above. Electronically Signed   By: Bary Richard M.D.   On: 03/02/2017 22:38    ____________________________________________   PROCEDURES  Procedure(s) performed: None  .Splint Application Date/Time: 03/02/2017 10:47 PM Performed by: Meliton Rattan, NT Authorized by: Joni Reining, PA-C   Consent:    Consent obtained:  Verbal   Consent given by:  Patient   Risks discussed:  Numbness  and swelling Pre-procedure details:    Sensation:  Normal Procedure details:    Laterality:  Left   Location:  Finger   Finger:  R ring finger and R small finger   Strapping: no     Splint type:  Finger   Supplies:  Aluminum splint Post-procedure details:    Pain:  Unchanged   Sensation:  Normal   Patient tolerance of procedure:  Tolerated well, no immediate complications    Critical Care performed: No  ____________________________________________   INITIAL IMPRESSION / ASSESSMENT AND PLAN / ED COURSE  As part of my medical decision making, I reviewed the following data within the electronic MEDICAL RECORD NUMBER    Pain to the left fourth and fifth digit secondary to contusion.  Discussed  x-ray findings with patient.  Patient placed in a finger splint and given discharge care instructions.  Advised take medication as directed.  Patient advised to follow-up with companies Worker's Compensation clinic if no improvement in 2-3 days.      ____________________________________________   FINAL CLINICAL IMPRESSION(S) / ED DIAGNOSES  Final diagnoses:  Contusion of left ring finger without damage to nail, initial encounter  Contusion of left little finger without damage to nail, initial encounter     ED Discharge Orders        Ordered    traMADol (ULTRAM) 50 MG tablet  Every 6 hours PRN     03/02/17 2253    ibuprofen (ADVIL,MOTRIN) 600 MG tablet  Every 8 hours PRN     03/02/17 2253       Note:  This document was prepared using Dragon voice recognition software and may include unintentional dictation errors.    Joni Reining, PA-C 03/02/17 2254    Willy Eddy, MD 03/03/17 [phone]

## 2017-03-02 NOTE — ED Notes (Signed)
Pt presents with hand pain after her hand was crushed at work. Pt states she doesn't know a supervisor name or the [employer]. EDP aware.

## 2017-03-02 NOTE — Discharge Instructions (Signed)
Finger splint for 2-3 days as needed °

## 2017-04-13 ENCOUNTER — Emergency Department
Admission: EM | Admit: 2017-04-13 | Discharge: 2017-04-13 | Disposition: A | Discharge status: Home / Self Care | Payer: Worker's Compensation | Attending: Emergency Medicine | Admitting: Emergency Medicine

## 2017-04-13 ENCOUNTER — Other Ambulatory Visit: Payer: Self-pay

## 2017-04-13 ENCOUNTER — Emergency Department: Payer: Worker's Compensation

## 2017-04-13 DIAGNOSIS — Z79899 Other long term (current) drug therapy: Secondary | ICD-10-CM | POA: Insufficient documentation

## 2017-04-13 DIAGNOSIS — Y9289 Other specified places as the place of occurrence of the external cause: Secondary | ICD-10-CM | POA: Insufficient documentation

## 2017-04-13 DIAGNOSIS — Y990 Civilian activity done for income or pay: Secondary | ICD-10-CM | POA: Insufficient documentation

## 2017-04-13 DIAGNOSIS — Y9389 Activity, other specified: Secondary | ICD-10-CM | POA: Insufficient documentation

## 2017-04-13 DIAGNOSIS — I10 Essential (primary) hypertension: Secondary | ICD-10-CM | POA: Insufficient documentation

## 2017-04-13 DIAGNOSIS — J45909 Unspecified asthma, uncomplicated: Secondary | ICD-10-CM | POA: Insufficient documentation

## 2017-04-13 DIAGNOSIS — X509XXA Other and unspecified overexertion or strenuous movements or postures, initial encounter: Secondary | ICD-10-CM | POA: Insufficient documentation

## 2017-04-13 DIAGNOSIS — M7502 Adhesive capsulitis of left shoulder: Secondary | ICD-10-CM

## 2017-04-13 DIAGNOSIS — S43421A Sprain of right rotator cuff capsule, initial encounter: Secondary | ICD-10-CM

## 2017-04-13 DIAGNOSIS — F17210 Nicotine dependence, cigarettes, uncomplicated: Secondary | ICD-10-CM | POA: Insufficient documentation

## 2017-04-13 MED ORDER — PREDNISONE 10 MG (21) PO TBPK: ORAL_TABLET | ORAL | 0 refills | Status: DC

## 2017-04-13 MED ORDER — BACLOFEN 10 MG PO TABS: 10.0000 mg | ORAL_TABLET | Freq: Every day | ORAL | 1 refills | Status: AC

## 2017-04-13 NOTE — ED Provider Notes (Signed)
[facility] Emergency Department Provider Note  ____________________________________________   First MD Initiated Contact with Patient 04/13/17 [phone]     (approximate)  I have reviewed the triage vital signs and the nursing notes.   HISTORY  Chief Complaint Shoulder Pain    HPI Andrea Kim is a 47 y.o. female complains of left shoulder pain for at least a week.  She states she pulled you are in at work which requires some overhead reach and forward reach.  She has increased pain with movement.  She states she felt a pop at work about a week ago.  It does continue to have a popping sensation.  She cannot turn her arm and behind her back to hook her bra.  She denies any numbness or tingling.  She denies any neck pain.  Past Medical History:  Diagnosis Date  . Asthma   . Bronchitis   . Edema   . Hypertension     Patient Active Problem List   Diagnosis Date Noted  . Asthma with exacerbation 12/15/2014  . Depression, major, single episode, severe (HCC) 10/02/2014  . Asthma, chronic 07/17/2014  . Cough 03/20/2014  . Dyspnea 03/20/2014  . Tobacco abuse counseling 03/20/2014    Past Surgical History:  Procedure Laterality Date  . ABDOMINAL HYSTERECTOMY    . BACK SURGERY  05/2013  . etopic pregnancy      Prior to Admission medications   Medication Sig Start Date End Date Taking? Authorizing Provider  albuterol (PROVENTIL HFA;VENTOLIN HFA) 108 (90 BASE) MCG/ACT inhaler Inhale 2 puffs into the lungs every 4 (four) hours as needed for wheezing or shortness of breath. 03/20/14   Mungal, Eston Esters, MD  albuterol (PROVENTIL HFA;VENTOLIN HFA) 108 (90 Base) MCG/ACT inhaler Inhale 2 puffs into the lungs every 6 (six) hours as needed for wheezing or shortness of breath. 12/01/16   Darci Current, MD  baclofen (LIORESAL) 10 MG tablet Take 1 tablet (10 mg total) by mouth daily. 04/13/17 04/13/18  Jamilee Lafosse, Roselyn Bering, PA-C  gabapentin (NEURONTIN) 300 MG  capsule Take by mouth.    [provider]  predniSONE (STERAPRED UNI-PAK 21 TAB) 10 MG (21) TBPK tablet Take 6 pills on day one then decrease by 1 pill each day 04/13/17   Faythe Ghee, PA-C    Allergies Tramadol  Family History  Problem Relation Age of Onset  . Prostate cancer Father     Social History Social History   Tobacco Use  . Smoking status: Current Every Day Smoker    Packs/day: 0.10    Years: 30.00    Pack years: 3.00    Types: Cigarettes  . Smokeless tobacco: Never Used  Substance Use Topics  . Alcohol use: No    Alcohol/week: 0.0 oz  . Drug use: No    Review of Systems  Constitutional: No fever/chills Eyes: No visual changes. ENT: No sore throat. Respiratory: Denies cough Genitourinary: Negative for dysuria. Musculoskeletal: Negative for back pain.  Positive for left shoulder Skin: Negative for rash.    ____________________________________________   PHYSICAL EXAM:  VITAL SIGNS: ED Triage Vitals  Enc Vitals Group     BP 04/13/17 0825 (!) 143/96     Pulse Rate 04/13/17 0825 (!) 103     Resp 04/13/17 0825 17     Temp 04/13/17 0825 98.2 F (36.8 C)     Temp Source 04/13/17 0825 Oral     SpO2 04/13/17 0825 98 %     Weight 04/13/17  0826 241 lb (109.3 kg)     Height 04/13/17 0826 5\' 5"  (1.651 m)     Head Circumference --      Peak Flow --      Pain Score 04/13/17 0826 10     Pain Loc --      Pain Edu? --      Excl. in GC? --     Constitutional: Alert and oriented. Well appearing and in no acute distress. Eyes: Conjunctivae are normal.  Head: Atraumatic. Nose: No congestion/rhinnorhea. Mouth/Throat: Mucous membranes are moist.   Cardiovascular: Normal rate, regular rhythm. Respiratory: Normal respiratory effort.  No retractions GU: deferred Musculoskeletal: Decreased range of motion of the left shoulder.  The left shoulder is tender to palpation and appears swollen when compared to the right.  Pain at the lateral aspect of the  clavicle and the head of the humerus patient has decreased range of motion with overhead reach she can even get to 90 degrees.  She has decreased range of motion with abduction and abduction.  She is unable to do internal rotation.  Neurovascular is intact.  The left hand grip is slightly weaker than the right. Neurologic:  Normal speech and language.  Skin:  Skin is warm, dry and intact. No rash noted. Psychiatric: Mood and affect are normal. Speech and behavior are normal.  ____________________________________________   LABS (all labs ordered are listed, but only abnormal results are displayed)  Labs Reviewed - No data to display ____________________________________________   ____________________________________________  RADIOLOGY  X-ray of the left shoulder is negative for any acute abnormality.  There are some degenerative changes  ____________________________________________   PROCEDURES  Procedure(s) performed: Sling applied by the nurse  Procedures    ____________________________________________   INITIAL IMPRESSION / ASSESSMENT AND PLAN / ED COURSE  Pertinent labs & imaging results that were available during my care of the patient were reviewed by me and considered in my medical decision making (see chart for details).  Patient is 47 year old female complaining of left shoulder pain.  She felt a pop in the shoulder last week while at work.  She has to perform overhead reach and reaching forward to pull you are.  On physical exam the left shoulder is swollen and tender.  She has decreased range of motion in all planes.  The left hand grip is slightly weaker than the right.  Other than that neurovascular is intact  X-ray of the left shoulder    ----------------------------------------- 11:03 AM on 04/13/2017 -----------------------------------------  X-ray of the left shoulder is negative for acute abnormality.  X-ray results were discussed with patient.  She  was placed in a sling by the nurse.  She is to apply ice.  She is to take medication as prescribed.  She is not to use the left arm at work until reevaluated by orthopedics.  She states she understands comply with instructions.  A work note was given with instructions for [employer].  Patient was discharged in stable condition  As part of my medical decision making, I reviewed the following data within the electronic MEDICAL RECORD NUMBER Nursing notes reviewed and incorporated, Old chart reviewed, Radiograph reviewed x-ray of the left shoulder is negative, Notes from prior ED visits and [place] Controlled Substance Database  ____________________________________________   FINAL CLINICAL IMPRESSION(S) / ED DIAGNOSES  Final diagnoses:  Adhesive capsulitis of left shoulder  Sprain of right rotator cuff capsule, initial encounter      NEW MEDICATIONS STARTED DURING THIS VISIT:  Discharge Medication List as of 04/13/2017  9:54 AM    START taking these medications   Details  baclofen (LIORESAL) 10 MG tablet Take 1 tablet (10 mg total) by mouth daily., Starting Thu 04/13/2017, Until Fri 04/13/2018, Print    predniSONE (STERAPRED UNI-PAK 21 TAB) 10 MG (21) TBPK tablet Take 6 pills on day one then decrease by 1 pill each day, Print         Note:  This document was prepared using Dragon voice recognition software and may include unintentional dictation errors.    Faythe GheeFisher, Iseah Plouff W, PA-C 04/13/17 1104    Jeanmarie PlantMcShane, James A, MD 04/13/17 1131

## 2017-04-13 NOTE — ED Notes (Signed)
First Nurse Note:  Patient complaining of left shoulder pain related to work injury.  Attempted to locate "[employer]" and "BMI" in workmen's comp profile without success.

## 2017-04-13 NOTE — ED Notes (Addendum)
See triage note  Presents with pain to left shoulder   States she felt a pop to left shoulder while at work about 1 week ago  States pain is getting worse thur out the week  Increased pain with movement and some swelling noted to shoulder

## 2017-04-13 NOTE — Discharge Instructions (Signed)
Follow-up with Dr. Odis LusterBowers or a physician of workers comp choice for reevaluation of your shoulder pain.  Use ice to the left shoulder.  Use the sling for comfort.  You still need to take your arm out and perform exercises.  Stand near the wall and try to reach with your arm up the edge of the wall.  Take the medication as prescribed.  You should not use your left arm/shoulder at work until you have been evaluated by orthopedics.

## 2017-04-13 NOTE — ED Triage Notes (Signed)
Pt c/o left shoulder pain for over a week. 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

## 2018-11-22 ENCOUNTER — Other Ambulatory Visit: Payer: Self-pay

## 2018-11-22 ENCOUNTER — Encounter: Payer: Self-pay | Admitting: Emergency Medicine

## 2018-11-22 ENCOUNTER — Emergency Department: Payer: BC Managed Care – PPO

## 2018-11-22 ENCOUNTER — Emergency Department
Admission: EM | Admit: 2018-11-22 | Discharge: 2018-11-22 | Disposition: A | Discharge status: Home / Self Care | Payer: BC Managed Care – PPO | Attending: Student in an Organized Health Care Education/Training Program | Admitting: Student in an Organized Health Care Education/Training Program

## 2018-11-22 DIAGNOSIS — Z79899 Other long term (current) drug therapy: Secondary | ICD-10-CM | POA: Diagnosis not present

## 2018-11-22 DIAGNOSIS — I10 Essential (primary) hypertension: Secondary | ICD-10-CM | POA: Diagnosis not present

## 2018-11-22 DIAGNOSIS — F17210 Nicotine dependence, cigarettes, uncomplicated: Secondary | ICD-10-CM | POA: Insufficient documentation

## 2018-11-22 DIAGNOSIS — J45909 Unspecified asthma, uncomplicated: Secondary | ICD-10-CM | POA: Diagnosis not present

## 2018-11-22 DIAGNOSIS — R102 Pelvic and perineal pain: Secondary | ICD-10-CM | POA: Diagnosis present

## 2018-11-22 DIAGNOSIS — R1030 Lower abdominal pain, unspecified: Secondary | ICD-10-CM | POA: Insufficient documentation

## 2018-11-22 LAB — URINALYSIS, COMPLETE (UACMP) WITH MICROSCOPIC
Bilirubin Urine: NEGATIVE
Glucose, UA: NEGATIVE mg/dL
Hgb urine dipstick: NEGATIVE
Ketones, ur: NEGATIVE mg/dL
Leukocytes,Ua: NEGATIVE
Nitrite: POSITIVE — AB
Protein, ur: NEGATIVE mg/dL
Specific Gravity, Urine: 1.018 (ref 1.005–1.030)
pH: 5 (ref 5.0–8.0)

## 2018-11-22 LAB — CBC
HCT: 38.1 % (ref 36.0–46.0)
Hemoglobin: 11.7 g/dL — ABNORMAL LOW (ref 12.0–15.0)
MCH: 29.8 pg (ref 26.0–34.0)
MCHC: 30.7 g/dL (ref 30.0–36.0)
MCV: 97.2 fL (ref 80.0–100.0)
Platelets: 258 10*3/uL (ref 150–400)
RBC: 3.92 MIL/uL (ref 3.87–5.11)
RDW: 13.9 % (ref 11.5–15.5)
WBC: 5.9 10*3/uL (ref 4.0–10.5)
nRBC: 0 % (ref 0.0–0.2)

## 2018-11-22 LAB — COMPREHENSIVE METABOLIC PANEL
ALT: 23 U/L (ref 0–44)
AST: 16 U/L (ref 15–41)
Albumin: 4.2 g/dL (ref 3.5–5.0)
Alkaline Phosphatase: 71 U/L (ref 38–126)
Anion gap: 10 (ref 5–15)
BUN: 20 mg/dL (ref 6–20)
CO2: 25 mmol/L (ref 22–32)
Calcium: 9.3 mg/dL (ref 8.9–10.3)
Chloride: 103 mmol/L (ref 98–111)
Creatinine, Ser: 1.05 mg/dL — ABNORMAL HIGH (ref 0.44–1.00)
GFR calc Af Amer: >60 mL/min (ref >60)
GFR calc non Af Amer: >60 mL/min (ref >60)
Glucose, Bld: 103 mg/dL — ABNORMAL HIGH (ref 70–99)
Potassium: 4.8 mmol/L (ref 3.5–5.1)
Sodium: 138 mmol/L (ref 135–145)
Total Bilirubin: 0.7 mg/dL (ref 0.3–1.2)
Total Protein: 7.2 g/dL (ref 6.5–8.1)

## 2018-11-22 LAB — PREGNANCY, URINE: Preg Test, Ur: NEGATIVE

## 2018-11-22 LAB — LIPASE, BLOOD: Lipase: 24 U/L (ref 11–51)

## 2018-11-22 MED ORDER — PHENAZOPYRIDINE HCL 200 MG PO TABS: 200.0000 mg | ORAL_TABLET | Freq: Three times a day (TID) | ORAL | 0 refills | Status: AC | PRN

## 2018-11-22 MED ORDER — HYDROCODONE-ACETAMINOPHEN 5-325 MG PO TABS
2.0000 | ORAL_TABLET | Freq: Once | ORAL | Status: AC
  Administered 2018-11-22: 2 via ORAL
  Filled 2018-11-22: qty 2

## 2018-11-22 MED ORDER — ONDANSETRON HCL 4 MG/2ML IJ SOLN
4.0000 mg | Freq: Once | INTRAMUSCULAR | Status: AC
  Administered 2018-11-22: 4 mg via INTRAVENOUS
  Filled 2018-11-22: qty 2

## 2018-11-22 MED ORDER — MORPHINE SULFATE (PF) 4 MG/ML IV SOLN
4.0000 mg | INTRAVENOUS | Status: DC | PRN
Start: 2018-11-22 — End: 2018-11-22
  Administered 2018-11-22: 4 mg via INTRAVENOUS
  Filled 2018-11-22: qty 1

## 2018-11-22 MED ORDER — MORPHINE SULFATE (PF) 4 MG/ML IV SOLN: 4.0000 mg | Freq: Once | INTRAVENOUS | Status: DC

## 2018-11-22 MED ORDER — NITROFURANTOIN MONOHYD MACRO 100 MG PO CAPS
100.0000 mg | ORAL_CAPSULE | Freq: Once | ORAL | Status: AC
  Administered 2018-11-22: 100 mg via ORAL
  Filled 2018-11-22: qty 1

## 2018-11-22 MED ORDER — NITROFURANTOIN MONOHYD MACRO 100 MG PO CAPS: 100.0000 mg | ORAL_CAPSULE | Freq: Two times a day (BID) | ORAL | 0 refills | Status: AC

## 2018-11-22 NOTE — ED Notes (Signed)
Dr. Goodman at bedside.  

## 2018-11-22 NOTE — ED Notes (Signed)
Attempted to call us to inform them that pt was ready- no answer

## 2018-11-22 NOTE — ED Triage Notes (Signed)
PT arrives with complaints of lower abdominal pain on left and right that started 2 days prior. Pt reports known cyst but states no relief from regimen of OTC medications. Pt denies n/v/d.

## 2018-11-22 NOTE — ED Notes (Signed)
Patient transported to Ultrasound 

## 2018-11-22 NOTE — ED Notes (Signed)
Pillow brought to room

## 2018-11-22 NOTE — Discharge Instructions (Addendum)

## 2018-11-22 NOTE — ED Provider Notes (Addendum)
[facility] Emergency Department Provider Note    None    (approximate)  I have reviewed the triage vital signs and the nursing notes.   HISTORY  Chief Complaint Abdominal Pain    HPI Andrea Kim is a 48 y.o. female the below listed past medical history with known ovarian cyst presents to the ER for several days of progressively worsening pelvic discomfort no vaginal bleeding or discharge.  States her urine has been darker in color.  Has not been taking any antibiotics.  Denies any flank pain.  States the pain today is severe and feels consistent with her cyst pain.  Tried following up with OB/GYN but states that she cannot get an appointment for Avril months.  Denies any nausea or vomiting.    Past Medical History:  Diagnosis Date  . Asthma   . Bronchitis   . Edema   . Hypertension    Family History  Problem Relation Age of Onset  . Prostate cancer Father    Past Surgical History:  Procedure Laterality Date  . ABDOMINAL HYSTERECTOMY    . BACK SURGERY  05/2013  . etopic pregnancy     Patient Active Problem List   Diagnosis Date Noted  . Asthma with exacerbation 12/15/2014  . Depression, major, single episode, severe (HCC) 10/02/2014  . Asthma, chronic 07/17/2014  . Cough 03/20/2014  . Dyspnea 03/20/2014  . Tobacco abuse counseling 03/20/2014      Prior to Admission medications   Medication Sig Start Date End Date Taking? Authorizing Provider  albuterol (PROVENTIL HFA;VENTOLIN HFA) 108 (90 BASE) MCG/ACT inhaler Inhale 2 puffs into the lungs every 4 (four) hours as needed for wheezing or shortness of breath. 03/20/14   Mungal, Eston Esters, MD  albuterol (PROVENTIL HFA;VENTOLIN HFA) 108 (90 Base) MCG/ACT inhaler Inhale 2 puffs into the lungs every 6 (six) hours as needed for wheezing or shortness of breath. 12/01/16   Darci Current, MD  gabapentin (NEURONTIN) 300 MG capsule Take by mouth.    [provider]   nitrofurantoin, macrocrystal-monohydrate, (MACROBID) 100 MG capsule Take 1 capsule (100 mg total) by mouth 2 (two) times daily for 3 days. 11/22/18 11/25/18  Willy Eddy, MD  predniSONE (STERAPRED UNI-PAK 21 TAB) 10 MG (21) TBPK tablet Take 6 pills on day one then decrease by 1 pill each day 04/13/17   Faythe Ghee, PA-C    Allergies Tramadol    Social History Social History   Tobacco Use  . Smoking status: Current Every Day Smoker    Packs/day: 0.10    Years: 30.00    Pack years: 3.00    Types: Cigarettes  . Smokeless tobacco: Never Used  Substance Use Topics  . Alcohol use: No    Alcohol/week: 0.0 standard drinks  . Drug use: No    Review of Systems Patient denies headaches, rhinorrhea, blurry vision, numbness, shortness of breath, chest pain, edema, cough, abdominal pain, nausea, vomiting, diarrhea, dysuria, fevers, rashes or hallucinations unless otherwise stated above in HPI. ____________________________________________   PHYSICAL EXAM:  VITAL SIGNS: Vitals:   11/22/18 1102 11/22/18 1109  BP: 130/81   Pulse:  92  Resp: 16   Temp: 98.3 F (36.8 C)   SpO2: 99%     Constitutional: Alert and oriented.  Eyes: Conjunctivae are normal.  Head: Atraumatic. Nose: No congestion/rhinnorhea. Mouth/Throat: Mucous membranes are moist.   Neck: No stridor. Painless ROM.  Cardiovascular: Normal rate, regular rhythm. Grossly normal heart sounds.  Good peripheral  circulation. Respiratory: Normal respiratory effort.  No retractions. Lungs CTAB. Gastrointestinal: Soft and nontender. No distention. No abdominal bruits. No CVA tenderness. Genitourinary: deferred Musculoskeletal: No lower extremity tenderness nor edema.  No joint effusions. Neurologic:  Normal speech and language. No gross focal neurologic deficits are appreciated. No facial droop Skin:  Skin is warm, dry and intact. No rash noted. Psychiatric: Mood and affect are normal. Speech and behavior are normal.   ____________________________________________   LABS (all labs ordered are listed, but only abnormal results are displayed)  Results for orders placed or performed during the hospital encounter of 11/22/18 (from the past 24 hour(s))  Lipase, blood     Status: None   Collection Time: 11/22/18 11:15 AM  Result Value Ref Range   Lipase 24 11 - 51 U/L  Comprehensive metabolic panel     Status: Abnormal   Collection Time: 11/22/18 11:15 AM  Result Value Ref Range   Sodium 138 135 - 145 mmol/L   Potassium 4.8 3.5 - 5.1 mmol/L   Chloride 103 98 - 111 mmol/L   CO2 25 22 - 32 mmol/L   Glucose, Bld 103 (H) 70 - 99 mg/dL   BUN 20 6 - 20 mg/dL   Creatinine, Ser 1.05 (H) 0.44 - 1.00 mg/dL   Calcium 9.3 8.9 - 10.3 mg/dL   Total Protein 7.2 6.5 - 8.1 g/dL   Albumin 4.2 3.5 - 5.0 g/dL   AST 16 15 - 41 U/L   ALT 23 0 - 44 U/L   Alkaline Phosphatase 71 38 - 126 U/L   Total Bilirubin 0.7 0.3 - 1.2 mg/dL   GFR calc non Af Amer >60 >60 mL/min   GFR calc Af Amer >60 >60 mL/min   Anion gap 10 5 - 15  CBC     Status: Abnormal   Collection Time: 11/22/18 11:15 AM  Result Value Ref Range   WBC 5.9 4.0 - 10.5 K/uL   RBC 3.92 3.87 - 5.11 MIL/uL   Hemoglobin 11.7 (L) 12.0 - 15.0 g/dL   HCT 38.1 36.0 - 46.0 %   MCV 97.2 80.0 - 100.0 fL   MCH 29.8 26.0 - 34.0 pg   MCHC 30.7 30.0 - 36.0 g/dL   RDW 13.9 11.5 - 15.5 %   Platelets 258 150 - 400 K/uL   nRBC 0.0 0.0 - 0.2 %  Urinalysis, Complete w Microscopic     Status: Abnormal   Collection Time: 11/22/18 12:57 PM  Result Value Ref Range   Color, Urine YELLOW (A) YELLOW   APPearance CLEAR (A) CLEAR   Specific Gravity, Urine 1.018 1.005 - 1.030   pH 5.0 5.0 - 8.0   Glucose, UA NEGATIVE NEGATIVE mg/dL   Hgb urine dipstick NEGATIVE NEGATIVE   Bilirubin Urine NEGATIVE NEGATIVE   Ketones, ur NEGATIVE NEGATIVE mg/dL   Protein, ur NEGATIVE NEGATIVE mg/dL   Nitrite POSITIVE (A) NEGATIVE   Leukocytes,Ua NEGATIVE NEGATIVE   RBC / HPF 0-5 0 - 5 RBC/hpf    WBC, UA 0-5 0 - 5 WBC/hpf   Bacteria, UA RARE (A) NONE SEEN   Squamous Epithelial / LPF 0-5 0 - 5   Mucus PRESENT   Pregnancy, urine     Status: None   Collection Time: 11/22/18 12:57 PM  Result Value Ref Range   Preg Test, Ur NEGATIVE NEGATIVE   ____________________________________________ ____________________________________________  RADIOLOGY  I personally reviewed all radiographic images ordered to evaluate for the above acute complaints and reviewed radiology reports and  findings.  These findings were personally discussed with the patient.  Please see medical record for radiology report.  ____________________________________________   PROCEDURES  Procedure(s) performed:  Procedures    Critical Care performed: no ____________________________________________   INITIAL IMPRESSION / ASSESSMENT AND PLAN / ED COURSE  Pertinent labs & imaging results that were available during my care of the patient were reviewed by me and considered in my medical decision making (see chart for details).   DDX: Ovarian cyst, torsion, cystitis, stone,  Andrea Kim is a 48 y.o. who presents to the ED with suprapubic discomfort as described above.  Patient arrives afebrile hemodynamically stable with reassuring blood work.  May be having some component of cystitis but no evidence of clear infection by urine.  She states that feels consistent with her cyst pain.  Feels like it slightly more pronounced this time.  Will give pain medication.  Her abdominal exam is without any peritonitis rebound or guarding.  Does not seem clinically consistent with perforation or abscess.  Does not seem consistent with kidney stone.  She is denying any symptoms of vaginal discharge or bleeding.  Will order ultrasound if that is reassuring I believe it should be appropriate for trial of pain medication at home and outpatient follow-up.  Clinical Course as of Nov 22 1451  Thu Nov 22, 2018  1450 Patient  requesting additional IV pain medication.  On review of her PMP does appear that she has chronic prescription for narcotic medication.  Blood work is reassuring.  On review of records also has been seen multiple times for various evaluations of chronic pelvic pain.  She denies any discharge or bleeding.  Patient was informed that we will proceed with evaluation of any acute intra-abdominal process but will not be discharging with any additional narcotic medication unless something acute has been identified.  Have discussed with the patient and available family all diagnostics and treatments performed thus far and all questions were answered to the best of my ability. The patient demonstrates understanding and agreement with plan.    [PR]    Clinical Course User Index [PR] Willy Eddyobinson, Enrique Weiss, MD    The patient was evaluated in Emergency Department today for the symptoms described in the history of present illness. He/she was evaluated in the context of the global COVID-19 pandemic, which necessitated consideration that the patient might be at risk for infection with the SARS-CoV-2 virus that causes COVID-19. Institutional protocols and algorithms that pertain to the evaluation of patients at risk for COVID-19 are in a state of rapid change based on information released by regulatory bodies including the CDC and federal and state organizations. These policies and algorithms were followed during the patient's care in the ED.  As part of my medical decision making, I reviewed the following data within the electronic MEDICAL RECORD NUMBER Nursing notes reviewed and incorporated, Labs reviewed, notes from prior ED visits and [place] Controlled Substance Database   ____________________________________________   FINAL CLINICAL IMPRESSION(S) / ED DIAGNOSES  Final diagnoses:  Lower abdominal pain      NEW MEDICATIONS STARTED DURING THIS VISIT:  New Prescriptions   NITROFURANTOIN, MACROCRYSTAL-MONOHYDRATE,  (MACROBID) 100 MG CAPSULE    Take 1 capsule (100 mg total) by mouth 2 (two) times daily for 3 days.     Note:  This document was prepared using Dragon voice recognition software and may include unintentional dictation errors.    Willy Eddyobinson, Sumayya Muha, MD 11/22/18 1441    Willy Eddyobinson, Marygrace Sandoval, MD 11/22/18 [phone]

## 2018-11-22 NOTE — ED Notes (Signed)
Called ultrasound and told them pt was ready- stated they would come get her in the next 10 minutes

## 2018-11-22 NOTE — ED Provider Notes (Signed)
Patient Andrea Kim has returned and does not show any concerning findings for ovarian cyst or torsion. Discussed this with the patient. Also discussed findings of UTI. At this point patient felt comfortable deferring CT imaging which I think is reasonable given lack of leukocytosis and fever. Discussed follow up with ob/gyn.   Nance Pear, MD 11/22/18 [phone]

## 2018-11-22 NOTE — ED Triage Notes (Signed)
Says she has cysts on both ovaries and pain is worse today.

## 2020-02-24 IMAGING — US US PELVIS COMPLETE TRANSABD/TRANSVAG W DUPLEX
1 series · 13 of 25 positions shown · non-contrast
Comparison: 06/28/2016

CLINICAL DATA: History of ovarian cyst. Pelvic pain for 2 days.
Hysterectomy.

EXAM:
TRANSABDOMINAL AND TRANSVAGINAL ULTRASOUND OF PELVIS
DOPPLER ULTRASOUND OF OVARIES
TECHNIQUE: Both transabdominal and transvaginal ultrasound examinations of the
pelvis were performed. Transabdominal technique was performed for
global imaging of the pelvis including uterus, ovaries, adnexal
regions, and pelvic cul-de-sac.
It was necessary to proceed with endovaginal exam following the
transabdominal exam to visualize the or adnexal regions and ovaries.
Color and duplex Doppler ultrasound was utilized to evaluate blood
flow to the ovaries.

[Series 1: us pelvis complete transabd/transvag w duplex · 13 of 74 slices shown]
[im 1/74]
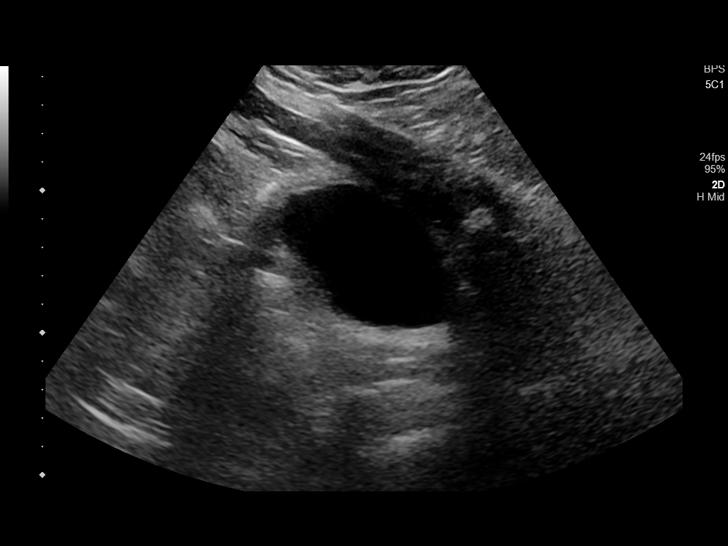
[im 7/74]
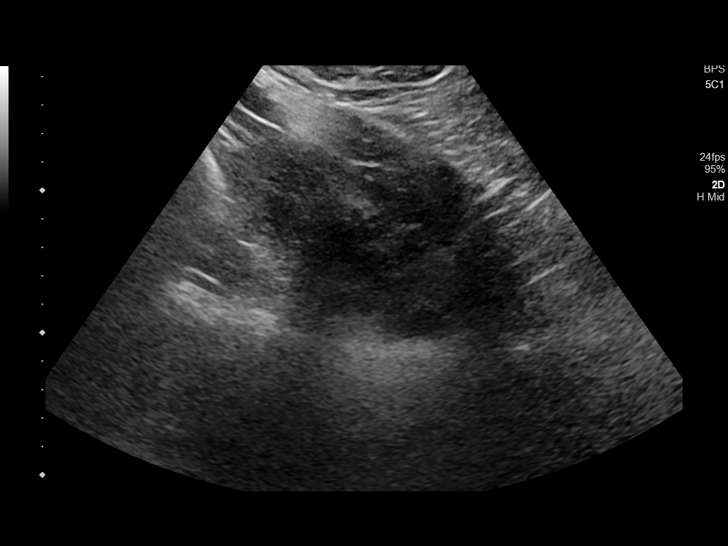
[im 13/74]
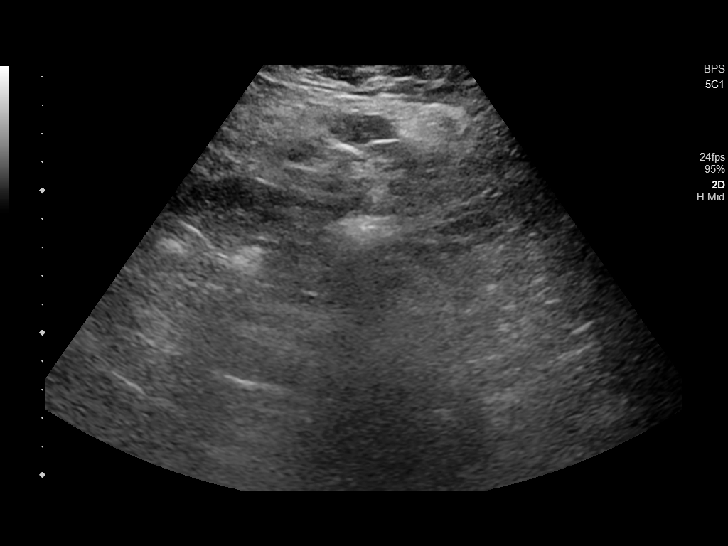
[im 19/74]
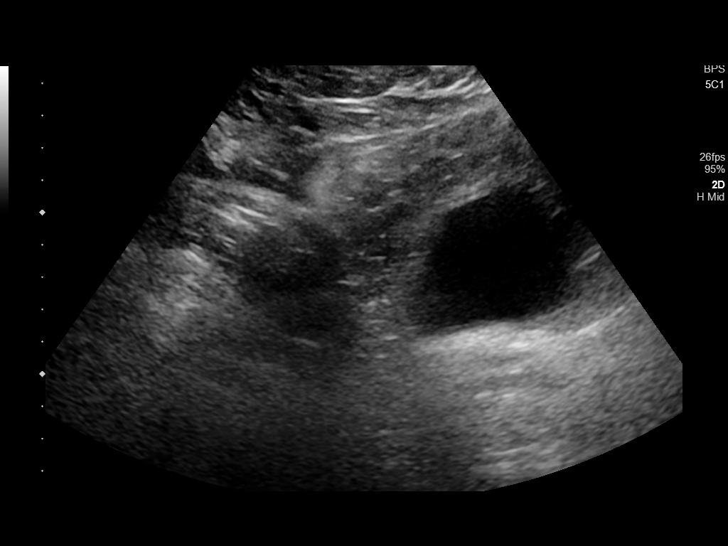
[im 25/74]
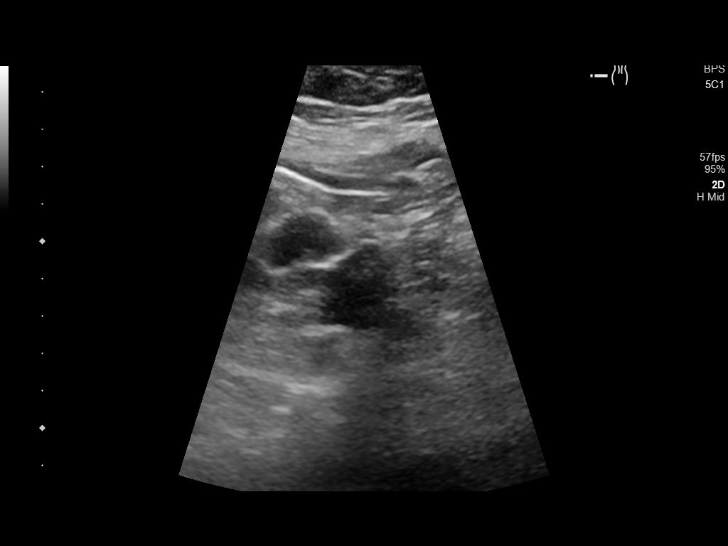
[im 31/74]
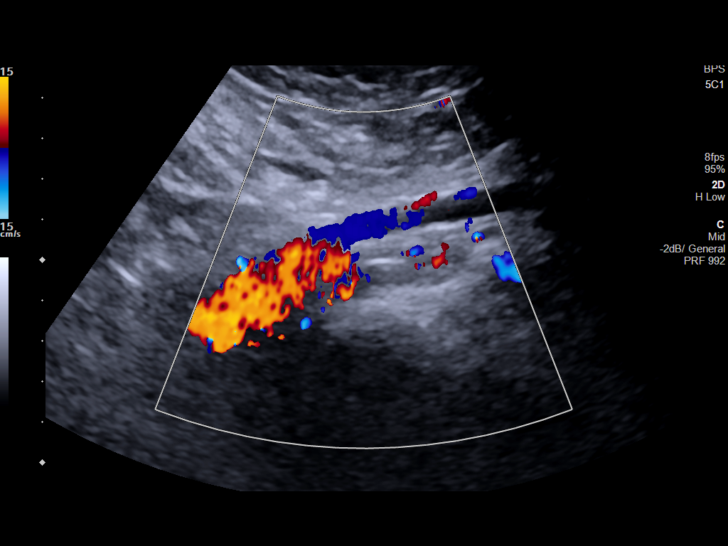
[im 37/74]
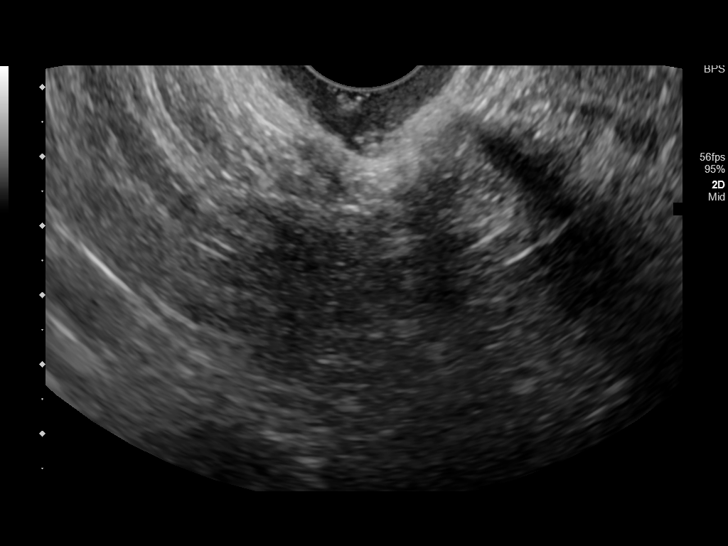
[im 43/74]
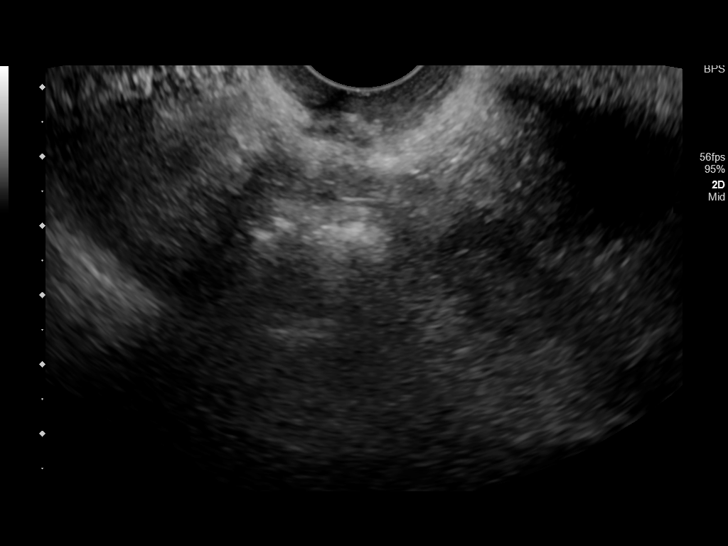
[im 49/74]
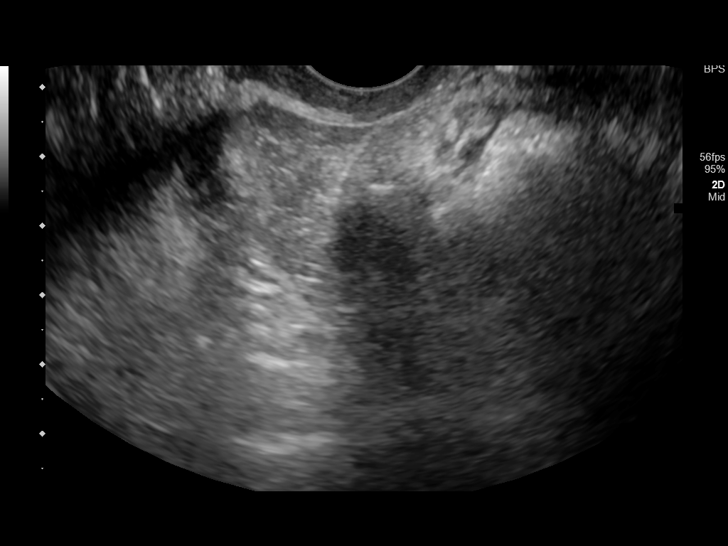
[im 55/74]
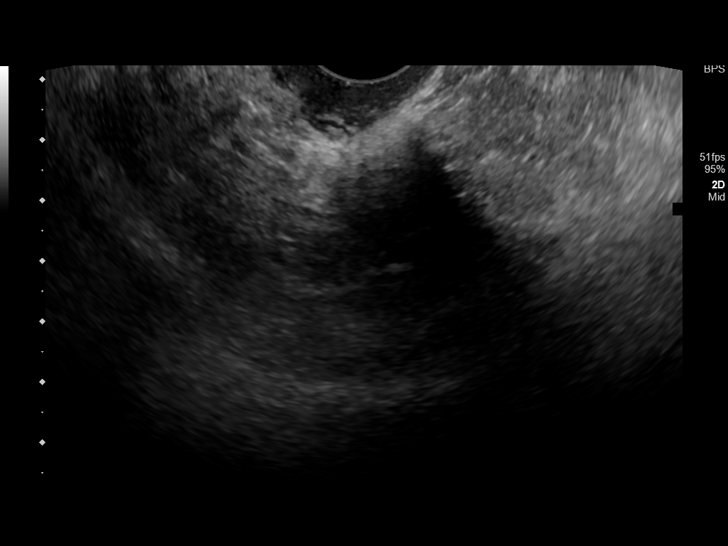
[im 61/74]
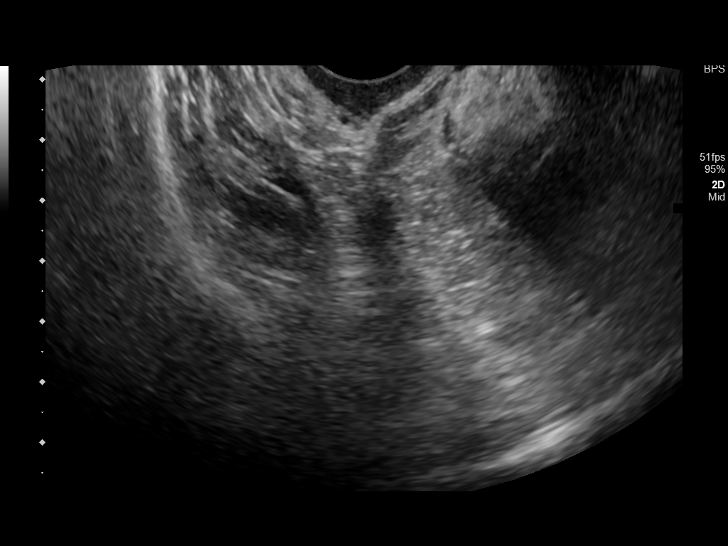
[im 67/74]
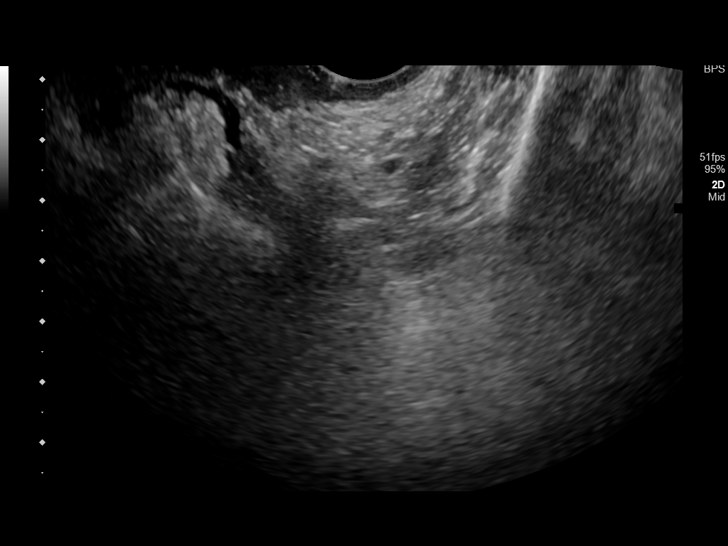
[im 74/74]
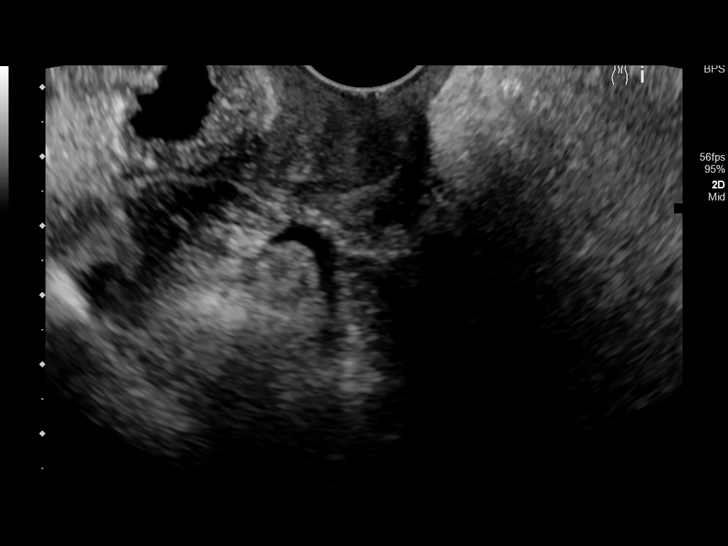

[13 of 25 positions shown; findings below may reference images not displayed]

FINDINGS: Uterus

Measurements: The uterus is surgically absent. Vaginal cuff is
unremarkable.

Endometrium

Absent

Right ovary

Measurements: 2.7 x 2.1 x 2.2 centimeters = volume: 6.5 mL. Normal
appearance on transabdominal evaluation

Left ovary

Measurements: The ovary is not visualized, either absent or
obscured.

Pulsed Doppler evaluation of the visualized LEFT ovary demonstrates
normal low-resistance arterial and venous waveforms.

Other findings

No abnormal free fluid.
IMPRESSION: 1. Hysterectomy.
2. Normal appearance of the RIGHT ovary; nonvisualized LEFT ovary.
No evidence for RIGHT ovarian torsion.

## 2020-09-04 ENCOUNTER — Other Ambulatory Visit: Payer: Self-pay | Admitting: Primary Care

## 2020-09-04 DIAGNOSIS — Z1231 Encounter for screening mammogram for malignant neoplasm of breast: Secondary | ICD-10-CM

## 2020-10-26 ENCOUNTER — Encounter: Payer: Self-pay | Admitting: Family Medicine

## 2020-10-26 ENCOUNTER — Other Ambulatory Visit: Payer: Self-pay

## 2020-10-26 ENCOUNTER — Ambulatory Visit: Payer: Self-pay | Admitting: Family Medicine

## 2020-10-26 DIAGNOSIS — F419 Anxiety disorder, unspecified: Secondary | ICD-10-CM

## 2020-10-26 DIAGNOSIS — B379 Candidiasis, unspecified: Secondary | ICD-10-CM

## 2020-10-26 DIAGNOSIS — F32A Depression, unspecified: Secondary | ICD-10-CM

## 2020-10-26 DIAGNOSIS — Z299 Encounter for prophylactic measures, unspecified: Secondary | ICD-10-CM

## 2020-10-26 DIAGNOSIS — Z113 Encounter for screening for infections with a predominantly sexual mode of transmission: Secondary | ICD-10-CM

## 2020-10-26 LAB — WET PREP FOR TRICH, YEAST, CLUE: Trichomonas Exam: NEGATIVE

## 2020-10-26 MED ORDER — PENICILLIN G BENZATHINE 2400000 UNIT/4ML IM SUSP
2.4000 10*6.[IU] | Freq: Once | INTRAMUSCULAR | Status: AC
  Administered 2020-10-26: 2400000 [IU] via INTRAMUSCULAR

## 2020-10-26 MED ORDER — CLOTRIMAZOLE 1 % VA CREA: 1.0000 | TOPICAL_CREAM | Freq: Every day | VAGINAL | 0 refills | Status: AC
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
## 2020-10-26 NOTE — Progress Notes (Signed)
Pt here for STD screening, testing and possible treatment for Syphilis.  Wet mount results reviewed and medication dispensed per Provider orders.  Bicillin 2.4 MU given IM without any complications. Berdie Ogren, RN

## 2020-10-27 ENCOUNTER — Telehealth: Payer: Self-pay | Admitting: Family Medicine

## 2020-10-29 ENCOUNTER — Encounter: Payer: Self-pay | Admitting: Physician Assistant

## 2020-10-29 DIAGNOSIS — A539 Syphilis, unspecified: Secondary | ICD-10-CM | POA: Insufficient documentation

## 2020-10-29 NOTE — Progress Notes (Signed)
Patient seen in clinic on 10/26/2020, having had a positive screening test for Syphilis at a plasma center.  Patient was screened and treated at that visit.  Patient's [facility] results reviewed today:  RPR = non-reactive, Syphilis TP= reactive.  Patient did have Syphilis and has been treated.  DIS notified of above and plans to contact patient for follow up.

## 2020-11-04 ENCOUNTER — Telehealth: Payer: Self-pay | Admitting: Family Medicine

## 2020-11-15 ENCOUNTER — Other Ambulatory Visit: Payer: Self-pay

## 2020-11-15 ENCOUNTER — Encounter: Payer: Self-pay | Admitting: Internal Medicine

## 2020-11-15 ENCOUNTER — Emergency Department: Payer: Commercial Managed Care - PPO

## 2020-11-15 ENCOUNTER — Observation Stay
Admission: EM | Admit: 2020-11-15 | Discharge: 2020-11-17 | Disposition: A | Discharge status: Home / Self Care | Payer: Commercial Managed Care - PPO | Attending: Emergency Medicine | Admitting: Emergency Medicine

## 2020-11-15 DIAGNOSIS — F17210 Nicotine dependence, cigarettes, uncomplicated: Secondary | ICD-10-CM | POA: Diagnosis not present

## 2020-11-15 DIAGNOSIS — Z6841 Body Mass Index (BMI) 40.0 and over, adult: Secondary | ICD-10-CM

## 2020-11-15 DIAGNOSIS — R0602 Shortness of breath: Secondary | ICD-10-CM | POA: Diagnosis not present

## 2020-11-15 DIAGNOSIS — F17200 Nicotine dependence, unspecified, uncomplicated: Secondary | ICD-10-CM | POA: Diagnosis not present

## 2020-11-15 DIAGNOSIS — Z79899 Other long term (current) drug therapy: Secondary | ICD-10-CM | POA: Insufficient documentation

## 2020-11-15 DIAGNOSIS — J45909 Unspecified asthma, uncomplicated: Secondary | ICD-10-CM | POA: Diagnosis not present

## 2020-11-15 DIAGNOSIS — Z20822 Contact with and (suspected) exposure to covid-19: Secondary | ICD-10-CM | POA: Insufficient documentation

## 2020-11-15 DIAGNOSIS — J441 Chronic obstructive pulmonary disease with (acute) exacerbation: Principal | ICD-10-CM | POA: Insufficient documentation

## 2020-11-15 DIAGNOSIS — R0600 Dyspnea, unspecified: Secondary | ICD-10-CM | POA: Diagnosis present

## 2020-11-15 DIAGNOSIS — I10 Essential (primary) hypertension: Secondary | ICD-10-CM | POA: Insufficient documentation

## 2020-11-15 DIAGNOSIS — I5033 Acute on chronic diastolic (congestive) heart failure: Secondary | ICD-10-CM

## 2020-11-15 DIAGNOSIS — Z23 Encounter for immunization: Secondary | ICD-10-CM | POA: Diagnosis not present

## 2020-11-15 HISTORY — DX: Morbid (severe) obesity due to excess calories: E66.01

## 2020-11-15 LAB — CBC
HCT: 35.4 % — ABNORMAL LOW (ref 36.0–46.0)
Hemoglobin: 11.3 g/dL — ABNORMAL LOW (ref 12.0–15.0)
MCH: 31.9 pg (ref 26.0–34.0)
MCHC: 31.9 g/dL (ref 30.0–36.0)
MCV: 100 fL (ref 80.0–100.0)
Platelets: 201 10*3/uL (ref 150–400)
RBC: 3.54 MIL/uL — ABNORMAL LOW (ref 3.87–5.11)
RDW: 13.8 % (ref 11.5–15.5)
WBC: 8.4 10*3/uL (ref 4.0–10.5)
nRBC: 0.2 % (ref 0.0–0.2)

## 2020-11-15 LAB — CBG MONITORING, ED: Glucose-Capillary: 125 mg/dL — ABNORMAL HIGH (ref 70–99)

## 2020-11-15 LAB — COMPREHENSIVE METABOLIC PANEL
ALT: 12 U/L (ref 0–44)
AST: 22 U/L (ref 15–41)
Albumin: 3.4 g/dL — ABNORMAL LOW (ref 3.5–5.0)
Alkaline Phosphatase: 64 U/L (ref 38–126)
Anion gap: 12 (ref 5–15)
BUN: 17 mg/dL (ref 6–20)
CO2: 21 mmol/L — ABNORMAL LOW (ref 22–32)
Calcium: 8.8 mg/dL — ABNORMAL LOW (ref 8.9–10.3)
Chloride: 108 mmol/L (ref 98–111)
Creatinine, Ser: 1.21 mg/dL — ABNORMAL HIGH (ref 0.44–1.00)
GFR, Estimated: 54 mL/min — ABNORMAL LOW (ref >60)
Glucose, Bld: 108 mg/dL — ABNORMAL HIGH (ref 70–99)
Potassium: 5.3 mmol/L — ABNORMAL HIGH (ref 3.5–5.1)
Sodium: 141 mmol/L (ref 135–145)
Total Bilirubin: 1.4 mg/dL — ABNORMAL HIGH (ref 0.3–1.2)
Total Protein: 6.5 g/dL (ref 6.5–8.1)

## 2020-11-15 LAB — RESP PANEL BY RT-PCR (FLU A&B, COVID) ARPGX2
Influenza A by PCR: NEGATIVE
Influenza B by PCR: NEGATIVE
SARS Coronavirus 2 by RT PCR: NEGATIVE

## 2020-11-15 LAB — LIPID PANEL
Cholesterol: 238 mg/dL — ABNORMAL HIGH (ref 0–200)
HDL: 94 mg/dL (ref >40)
LDL Cholesterol: 134 mg/dL — ABNORMAL HIGH (ref 0–99)
Total CHOL/HDL Ratio: 2.5 RATIO
Triglycerides: 50 mg/dL (ref <150)
VLDL: 10 mg/dL (ref 0–40)

## 2020-11-15 LAB — HIV ANTIBODY (ROUTINE TESTING W REFLEX): HIV Screen 4th Generation wRfx: NONREACTIVE

## 2020-11-15 LAB — BRAIN NATRIURETIC PEPTIDE: B Natriuretic Peptide: 188 pg/mL — ABNORMAL HIGH (ref 0.0–100.0)

## 2020-11-15 LAB — TROPONIN I (HIGH SENSITIVITY)
Troponin I (High Sensitivity): 10 ng/L (ref <18)
Troponin I (High Sensitivity): 10 ng/L (ref <18)

## 2020-11-15 MED ORDER — IPRATROPIUM-ALBUTEROL 0.5-2.5 (3) MG/3ML IN SOLN
6.0000 mL | Freq: Once | RESPIRATORY_TRACT | Status: AC
  Administered 2020-11-15: 6 mL via RESPIRATORY_TRACT
  Filled 2020-11-15: qty 6

## 2020-11-15 MED ORDER — AMITRIPTYLINE HCL 25 MG PO TABS
25.0000 mg | ORAL_TABLET | Freq: Every day | ORAL | Status: DC
  Administered 2020-11-15 – 2020-11-16 (×2): 25 mg via ORAL
  Filled 2020-11-15 (×3): qty 1

## 2020-11-15 MED ORDER — ALBUTEROL SULFATE (2.5 MG/3ML) 0.083% IN NEBU
2.5000 mg | INHALATION_SOLUTION | RESPIRATORY_TRACT | Status: DC | PRN
  Filled 2020-11-15: qty 3

## 2020-11-15 MED ORDER — BISACODYL 5 MG PO TBEC: 5.0000 mg | DELAYED_RELEASE_TABLET | Freq: Every day | ORAL | Status: DC | PRN

## 2020-11-15 MED ORDER — ONDANSETRON HCL 4 MG PO TABS: 4.0000 mg | ORAL_TABLET | Freq: Four times a day (QID) | ORAL | Status: DC | PRN

## 2020-11-15 MED ORDER — OXYCODONE HCL 5 MG PO TABS
5.0000 mg | ORAL_TABLET | ORAL | Status: DC | PRN
  Administered 2020-11-15 – 2020-11-16 (×4): 5 mg via ORAL
  Filled 2020-11-15 (×4): qty 1

## 2020-11-15 MED ORDER — NICOTINE 14 MG/24HR TD PT24: 14.0000 mg | MEDICATED_PATCH | Freq: Every day | TRANSDERMAL | Status: DC | PRN

## 2020-11-15 MED ORDER — DOCUSATE SODIUM 100 MG PO CAPS
100.0000 mg | ORAL_CAPSULE | Freq: Two times a day (BID) | ORAL | Status: DC
  Administered 2020-11-15 – 2020-11-17 (×5): 100 mg via ORAL
  Filled 2020-11-15 (×5): qty 1

## 2020-11-15 MED ORDER — INFLUENZA VAC SPLIT QUAD 0.5 ML IM SUSY
0.5000 mL | PREFILLED_SYRINGE | INTRAMUSCULAR | Status: AC
  Administered 2020-11-17: 0.5 mL via INTRAMUSCULAR
  Filled 2020-11-15 (×2): qty 0.5

## 2020-11-15 MED ORDER — FUROSEMIDE 10 MG/ML IJ SOLN
40.0000 mg | Freq: Two times a day (BID) | INTRAMUSCULAR | Status: DC
  Administered 2020-11-15 – 2020-11-16 (×2): 40 mg via INTRAVENOUS
  Filled 2020-11-15 (×2): qty 4

## 2020-11-15 MED ORDER — ONDANSETRON HCL 4 MG/2ML IJ SOLN: 4.0000 mg | Freq: Four times a day (QID) | INTRAMUSCULAR | Status: DC | PRN

## 2020-11-15 MED ORDER — ACETAMINOPHEN 325 MG PO TABS
650.0000 mg | ORAL_TABLET | Freq: Four times a day (QID) | ORAL | Status: DC | PRN
  Administered 2020-11-16: 650 mg via ORAL
  Filled 2020-11-15: qty 2

## 2020-11-15 MED ORDER — ENOXAPARIN SODIUM 60 MG/0.6ML IJ SOSY
60.0000 mg | PREFILLED_SYRINGE | Freq: Every day | INTRAMUSCULAR | Status: DC
  Administered 2020-11-15 – 2020-11-16 (×2): 60 mg via SUBCUTANEOUS
  Filled 2020-11-15 (×2): qty 0.6

## 2020-11-15 MED ORDER — METHYLPREDNISOLONE SODIUM SUCC 125 MG IJ SOLR
125.0000 mg | Freq: Once | INTRAMUSCULAR | Status: AC
  Administered 2020-11-15: 125 mg via INTRAVENOUS
  Filled 2020-11-15: qty 2

## 2020-11-15 MED ORDER — IPRATROPIUM-ALBUTEROL 0.5-2.5 (3) MG/3ML IN SOLN
3.0000 mL | Freq: Four times a day (QID) | RESPIRATORY_TRACT | Status: DC
  Administered 2020-11-15 – 2020-11-16 (×4): 3 mL via RESPIRATORY_TRACT
  Filled 2020-11-15 (×4): qty 3

## 2020-11-15 MED ORDER — POLYETHYLENE GLYCOL 3350 17 G PO PACK: 17.0000 g | PACK | Freq: Every day | ORAL | Status: DC | PRN

## 2020-11-15 MED ORDER — ACETAMINOPHEN 650 MG RE SUPP
650.0000 mg | Freq: Four times a day (QID) | RECTAL | Status: DC | PRN
  Filled 2020-11-15: qty 1

## 2020-11-15 MED ORDER — PREGABALIN 75 MG PO CAPS
75.0000 mg | ORAL_CAPSULE | Freq: Two times a day (BID) | ORAL | Status: DC
  Administered 2020-11-15 – 2020-11-17 (×5): 75 mg via ORAL
  Filled 2020-11-15 (×2): qty 1
  Filled 2020-11-15: qty 3
  Filled 2020-11-15 (×2): qty 1

## 2020-11-15 MED ORDER — QUETIAPINE FUMARATE 25 MG PO TABS
200.0000 mg | ORAL_TABLET | Freq: Every day | ORAL | Status: DC
  Administered 2020-11-15 – 2020-11-16 (×2): 200 mg via ORAL
  Filled 2020-11-15 (×2): qty 8

## 2020-11-15 MED ORDER — DOXYCYCLINE HYCLATE 100 MG PO TABS
100.0000 mg | ORAL_TABLET | Freq: Once | ORAL | Status: AC
  Administered 2020-11-15: 100 mg via ORAL
  Filled 2020-11-15: qty 1

## 2020-11-15 MED ORDER — SODIUM CHLORIDE 0.9% FLUSH
3.0000 mL | Freq: Two times a day (BID) | INTRAVENOUS | Status: DC
  Administered 2020-11-15 – 2020-11-17 (×5): 3 mL via INTRAVENOUS

## 2020-11-15 MED ORDER — FUROSEMIDE 10 MG/ML IJ SOLN
60.0000 mg | Freq: Once | INTRAMUSCULAR | Status: AC
  Administered 2020-11-15: 60 mg via INTRAVENOUS
  Filled 2020-11-15: qty 8

## 2020-11-15 MED ORDER — HYDRALAZINE HCL 20 MG/ML IJ SOLN: 5.0000 mg | INTRAMUSCULAR | Status: DC | PRN

## 2020-11-15 NOTE — ED Notes (Signed)
Rn to bedside to introduce self to pt. Pt resting. Husband at bedside. Pt provided with warm blanket.

## 2020-11-15 NOTE — ED Notes (Addendum)
Pt advised she has been having some "numbness" in the right side of her face that comes and goes. This has been going on x2 months. MD made aware.

## 2020-11-15 NOTE — Progress Notes (Signed)
PHARMACIST - PHYSICIAN COMMUNICATION  CONCERNING:  Enoxaparin (Lovenox) for DVT Prophylaxis    RECOMMENDATION: Patient was prescribed enoxaprin 40mg  q24 hours for VTE prophylaxis.   Filed Weights   11/15/20 0618  Weight: 117.9 kg (260 lb)    Body mass index is 43.27 kg/m.  Estimated Creatinine Clearance: 69.9 mL/min (A) (by C-G formula based on SCr of 1.21 mg/dL (H)).   Based on [facility] policy patient is candidate for enoxaparin 0.5mg /kg TBW SQ every 24 hours based on BMI being >30.  DESCRIPTION: Pharmacy has adjusted enoxaparin dose per [facility] policy.  Patient is now receiving enoxaparin 60 mg every 24 hours    [facility], PharmD, BCPS Clinical Pharmacist  11/15/2020 10:17 AM

## 2020-11-15 NOTE — TOC Initial Note (Signed)
Transition of Care [facility]) - Initial/Assessment Note    Patient Details  Name: Andrea Kim MRN: [id number] Date of Birth: 1970/08/24  [facility] CM/SW Contact:    Marina Goodell Phone Number: [phone] 11/15/2020, 12:01 PM  Clinical Narrative:                  Per ED RN note: Patient has stated she would like her healthcare information and treatment kept secret from her husband.  Patient presents to [facility] ED from home, due to SOB, progressively worse over the last two day, and bi-lateral leg swelling. Patient has hx of asthma and hypertension.  Patient continues to smoke tobacco. Patient reported she ran out of her albuterol inhaler "a while ago". Patient has not made PCP appointment to renew prescriptions.  Patient's main contact is spouse Donielle, [place] (Spouse)  [phone] [facility]).   Expected Discharge Plan: Skilled Nursing Facility Barriers to Discharge: Continued Medical Work up   Patient Goals and CMS Choice        Expected Discharge Plan and Services Expected Discharge Plan: Skilled Nursing Facility In-house Referral: Clinical Social Work   Post Acute Care Choice: Skilled Nursing Facility Living arrangements for the past 2 months: Single Family Home                                      Prior Living Arrangements/Services Living arrangements for the past 2 months: Single Family Home Lives with:: Spouse Karmella, [place] (Spouse)   [phone] (Mobile)) Patient language and need for interpreter reviewed:: Yes Do you feel safe going back to the place where you live?: Yes      Need for Family Participation in Patient Care: No (Comment) Care giver support system in place?: Yes (comment)   Criminal Activity/Legal Involvement Pertinent to Current Situation/Hospitalization: No - Comment as needed  Activities of Daily Living      Permission Sought/Granted   Permission granted to share information with : No               Emotional Assessment Appearance:: Appears stated age Attitude/Demeanor/Rapport: Engaged Affect (typically observed): Stable Orientation: : Oriented to Self, Oriented to Place, Oriented to  Time, Oriented to Situation Alcohol / Substance Use: Not Applicable Psych Involvement: No (comment)  Admission diagnosis:  Dyspnea [R06.00] Patient Active Problem List   Diagnosis Date Noted   Hypertension 11/15/2020   Morbid obesity with BMI of 40.0-44.9, adult (HCC) 11/15/2020   Tobacco dependence 11/15/2020   Syphilis 10/29/2020   Asthma with exacerbation 12/15/2014   Depression, major, single episode, severe (HCC) 10/02/2014   Asthma, chronic 07/17/2014   Cough 03/20/2014   Dyspnea 03/20/2014   Tobacco abuse counseling 03/20/2014   PCP:  Sandrea Hughs, NP Pharmacy:   [facility] [address]), [place] - [address] [place]) [place] [address] Phone: [phone] Fax: [phone]     Social Determinants of Health (SDOH) Interventions    Readmission Risk Interventions No flowsheet data found.
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
## 2020-11-15 NOTE — ED Notes (Signed)
RN to bedside to answer call bell. Pt was up ambulating to toilet. Then placed back in bed and attached to monitor.

## 2020-11-15 NOTE — ED Provider Notes (Signed)
[facility] Emergency Department Provider Note ____________________________________________   Event Date/Time   First MD Initiated Contact with Patient 11/15/20 0654     (approximate)  I have reviewed the triage vital signs and the nursing notes.  HISTORY  Chief Complaint Shortness of Breath   HPI Andrea Kim is a 50 y.o. femalewho presents to the ED for evaluation of SOB.   Chart review indicates hx asthma and hypertension.  Continued cigarette smoking.  Patient presents to the ED from home for evaluation of increasing shortness of breath over the past 1 week.  She reports shortness of breath, orthopnea, increased lower extremity swelling and minimally productive cough that has been progressively worsening over the past 1 week.  She reports that she ran out of her home albuterol inhaler "a while ago" and does not use a regular controller inhaler.  She reports some left-sided chest tightness that is intermittent, not exertional.  No chest pain right now.  Denies abdominal pain, emesis, syncope, diarrhea.  Does report poor appetite and generalized weakness.  Past Medical History:  Diagnosis Date   Asthma    Bronchitis    Edema    Hypertension     Patient Active Problem List   Diagnosis Date Noted   Syphilis 10/29/2020   Asthma with exacerbation 12/15/2014   Depression, major, single episode, severe (HCC) 10/02/2014   Asthma, chronic 07/17/2014   Cough 03/20/2014   Dyspnea 03/20/2014   Tobacco abuse counseling 03/20/2014    Past Surgical History:  Procedure Laterality Date   ABDOMINAL HYSTERECTOMY     BACK SURGERY  05/2013   etopic pregnancy      Prior to Admission medications   Medication Sig Start Date End Date Taking? Authorizing Provider  albuterol (PROVENTIL HFA;VENTOLIN HFA) 108 (90 BASE) MCG/ACT inhaler Inhale 2 puffs into the lungs every 4 (four) hours as needed for wheezing or shortness of breath. 03/20/14   Mungal,  Eston Esters, MD  albuterol (PROVENTIL HFA;VENTOLIN HFA) 108 (90 Base) MCG/ACT inhaler Inhale 2 puffs into the lungs every 6 (six) hours as needed for wheezing or shortness of breath. 12/01/16   Darci Current, MD  gabapentin (NEURONTIN) 300 MG capsule Take by mouth. Patient not taking: Reported on 10/26/2020    [provider]  predniSONE (STERAPRED UNI-PAK 21 TAB) 10 MG (21) TBPK tablet Take 6 pills on day one then decrease by 1 pill each day Patient not taking: Reported on 10/26/2020 04/13/17   Faythe Ghee, PA-C    Allergies Patient has no known allergies.  Family History  Problem Relation Age of Onset   Prostate cancer Father     Social History Social History   Tobacco Use   Smoking status: Every Day    Packs/day: 0.25    Years: 36.00    Pack years: 9.00    Types: Cigarettes   Smokeless tobacco: Never  Vaping Use   Vaping Use: Never used  Substance Use Topics   Alcohol use: Yes    Comment: socially ~ 2 x per year   Drug use: No    Review of Systems  Constitutional: No fever/chills Eyes: No visual changes. ENT: No sore throat. Cardiovascular: Positive for chest pain. Respiratory: Positive for productive cough and shortness of breath. Gastrointestinal: No abdominal pain. no vomiting.  No diarrhea.  No constipation. Genitourinary: Negative for dysuria. Musculoskeletal: Negative for back pain. Skin: Negative for rash. Neurological: Negative for headaches, focal weakness or numbness.  ____________________________________________   PHYSICAL EXAM:  VITAL SIGNS: Vitals:   11/15/20 0700 11/15/20 0730  BP: (!) 138/96 138/90  Pulse: 98 75  Resp: 20 (!) 26  Temp:    SpO2: 97% 98%     Constitutional: Alert and oriented. Obese, obviously tachypneic and dyspneic. Speaking in phrases.  Eyes: Conjunctivae are normal. PERRL. EOMI. Head: Atraumatic. Nose: No congestion/rhinnorhea. Mouth/Throat: Mucous membranes are moist.  Oropharynx non-erythematous. Neck:  No stridor. No cervical spine tenderness to palpation. Cardiovascular: Tachycardic rate, regular rhythm. Grossly normal heart sounds.  Good peripheral circulation. Respiratory: Tachypneic to about 30.  Diffuse expiratory wheezes and decreased air movement throughout.  Bibasilar crackles are present. Gastrointestinal: Soft , nondistended, nontender to palpation. No CVA tenderness. Musculoskeletal: No lower extremity tenderness  No joint effusions. No signs of acute trauma. Pitting edema symmetrically to bilateral lower extremities without overlying skin changes Neurologic:  Normal speech and language. No gross focal neurologic deficits are appreciated.  Skin:  Skin is warm, dry and intact. No rash noted. Psychiatric: Mood and affect are normal. Speech and behavior are normal.  ____________________________________________   LABS (all labs ordered are listed, but only abnormal results are displayed)  Labs Reviewed  CBC - Abnormal; Notable for the following components:      Result Value   RBC 3.54 (*)    Hemoglobin 11.3 (*)    HCT 35.4 (*)    All other components within normal limits  COMPREHENSIVE METABOLIC PANEL - Abnormal; Notable for the following components:   Potassium 5.3 (*)    CO2 21 (*)    Glucose, Bld 108 (*)    Creatinine, Ser 1.21 (*)    Calcium 8.8 (*)    Albumin 3.4 (*)    Total Bilirubin 1.4 (*)    GFR, Estimated 54 (*)    All other components within normal limits  BRAIN NATRIURETIC PEPTIDE - Abnormal; Notable for the following components:   B Natriuretic Peptide 188.0 (*)    All other components within normal limits  RESP PANEL BY RT-PCR (FLU A&B, COVID) [id number]  TROPONIN I (HIGH SENSITIVITY)  TROPONIN I (HIGH SENSITIVITY)   ____________________________________________  12 Lead EKG  Sinus tachycardia, rate of 103 bpm.  Normal axis and intervals.  A few PVCs.  No evidence of acute ischemia. ____________________________________________  RADIOLOGY  ED MD  interpretation:  CXR reviewed by me without evidence of acute cardiopulmonary pathology. Cardiomegaly noted.    Official radiology report(s): DG Chest 1 View  Result Date: 11/15/2020 CLINICAL DATA:  Shortness of breath. EXAM: CHEST  1 VIEW COMPARISON:  Chest x-rays dated 12/01/2016 and 05/17/2015. FINDINGS: Stable cardiomegaly. Lungs are clear. No pleural effusion or pneumothorax is seen. Osseous structures about the chest are unremarkable. IMPRESSION: No active disease. No evidence of pneumonia or pulmonary edema. Stable cardiomegaly. Electronically Signed   By: Bary Richard M.D.   On: 11/15/2020 06:39    ____________________________________________   PROCEDURES and INTERVENTIONS  Procedure(s) performed (including Critical Care):  .1-3 Lead EKG Interpretation Performed by: Delton Prairie, MD Authorized by: Delton Prairie, MD     Interpretation: abnormal     ECG rate assessment: tachycardic     Rhythm: sinus tachycardia     Ectopy: none     Conduction: normal    Medications  methylPREDNISolone sodium succinate (SOLU-MEDROL) 125 mg/2 mL injection 125 mg (125 mg Intravenous Given 11/15/20 0758)  ipratropium-albuterol (DUONEB) 0.5-2.5 (3) MG/3ML nebulizer solution 6 mL (6 mLs Nebulization Given 11/15/20 0743)  furosemide (LASIX) injection 60 mg (60 mg Intravenous Given 11/15/20 0755)  doxycycline (VIBRA-TABS) tablet 100 mg (100 mg Oral Given 11/15/20 0759)    ____________________________________________   MDM / ED COURSE   50 year old woman presents to the ED with evidence of COPD/CHF exacerbations requiring medical observation admission.  She is tachypneic but no hypoxia or instability.  X-ray without infiltration or PTX.  Blood work with elevated BNP and mild hyperkalemia.  No evidence of ACS.  Initiated diuresis with 1 dose of IV Lasix and provided breathing treatment/steroids for her smoking and likely COPD.  Due to her continued tachypnea and dyspnea, I think she would benefit  from further diuresis.  We will discuss with medicine for admission.  Clinical Course as of 11/15/20 8250  Wynelle Link Nov 15, 2020  5397 Husband pulls me aside outside the room and expresses concern that patient may be taking too much of her prescribed oxycodone [DS]  0852 Reassessed.  Patient walking of the restroom back to her stretcher, obviously tachypneic and dyspneic while ambulatory.  Also station reveals improved airflow and wheezing, but she still has stigmata of CHF exacerbation.  We discussed the possibility of observation admission, and she is in agreement. [DS]    Clinical Course User Index [DS] Delton Prairie, MD    ____________________________________________   FINAL CLINICAL IMPRESSION(S) / ED DIAGNOSES  Final diagnoses:  COPD exacerbation (HCC)  Shortness of breath  Acute on chronic diastolic congestive heart failure [facility])     ED Discharge Orders     None        Fredrico Beedle   Note:  This document was prepared using Dragon voice recognition software and may include unintentional dictation errors.    Delton Prairie, MD 11/15/20 [phone]

## 2020-11-15 NOTE — ED Notes (Signed)
Pt husband pulled MD and RN to the side this morning. He voiced some concerns about the patient taking too many sleeping pills at home and doesn't want her to know he has voiced his concerns. The wife then advised she would like her healthcare information and treatment kept secret from her husband,

## 2020-11-15 NOTE — ED Provider Notes (Signed)
Emergency Medicine Provider Triage Evaluation Note  Andrea Kim , a 50 y.o. female  was evaluated in triage.  Pt complains of shortness of breath.  Worse over the last 1 to 2 days.  No pain, but the shortness of breath scared her.  Worse with exertion.  No fever.  Review of Systems  Positive: Shortness of breath, wheezing. Negative: Chest pain, abdominal pain, fever  Physical Exam  BP 131/67   Pulse (!) 105   Temp 98.9 F (37.2 C)   Resp (!) 28   Ht 1.651 m (5\' 5" )   Wt 117.9 kg   SpO2 100%   BMI 43.27 kg/m  Gen:   Awake, mild respiratory distress. Resp:  Increased work of breathing with elevated rate, some accessory muscle usage, and pursed lip breathing.  Moving good air upon auscultation, no coarse breath sounds, no wheezing upon stethoscope auscultation. MSK:   Moves extremities without difficulty   Medical Decision Making  Medically screening exam initiated at 6:30 AM.  Appropriate orders placed.  Lenola Lui was informed that the remainder of the evaluation will be completed by another provider, this initial triage assessment does not replace that evaluation, and the importance of remaining in the ED until their evaluation is complete.  Labs pending, chest x-ray performed but not yet available, EKG done, no evidence of ischemia.  Refer to provider note for additional details.   Rolan Lipa, MD 11/15/20 [phone]

## 2020-11-15 NOTE — ED Notes (Signed)
ED TO INPATIENT HANDOFF REPORT  ED Nurse Name and Phone #:  [phone]  S Name/Age/Gender Andrea Kim 50 y.o. female Room/Bed: ED37A/ED37A  Code Status   Code Status: Full Code  Home/SNF/Other Home Patient oriented to: self, place, time, and situation Is this baseline? Yes   Triage Complete: Triage complete  Chief Complaint Dyspnea [R06.00]  Triage Note Pt arrives pursed lip breathing on exertion, shob, crackles noted. Pt states shob. Pt with pedal edema noted.    Allergies No Known Allergies  Level of Care/Admitting Diagnosis ED Disposition     ED Disposition  Admit   Condition  --   Comment  Hospital Area: [facility] [[id number]]  Level of Care: Med-Surg [16]  Covid Evaluation: Asymptomatic Screening Protocol (No Symptoms)  Diagnosis: Dyspnea [644034]  Admitting Physician: Jonah Blue [[id number]]  Attending Physician: Jonah Blue [[id number]]          B Medical/Surgery History Past Medical History:  Diagnosis Date   Asthma    Bronchitis    Edema    Hypertension    Morbid obesity with BMI of 40.0-44.9, adult [facility])    Past Surgical History:  Procedure Laterality Date   ABDOMINAL HYSTERECTOMY     BACK SURGERY  05/2013   etopic pregnancy       A IV Location/Drains/Wounds Patient Lines/Drains/Airways Status     Active Line/Drains/Airways     Name Placement date Placement time Site Days   Peripheral IV 11/15/20 20 G Left Hand 11/15/20  0755  Hand  less than 1            Intake/Output Last 24 hours No intake or output data in the 24 hours ending 11/15/20 1729  Labs/Imaging Results for orders placed or performed during the hospital encounter of 11/15/20 (from the past 48 hour(s))  CBC     Status: Abnormal   Collection Time: 11/15/20  6:19 AM  Result Value Ref Range   WBC 8.4 4.0 - 10.5 K/uL   RBC 3.54 (L) 3.87 - 5.11 MIL/uL   Hemoglobin 11.3 (L) 12.0 - 15.0 g/dL   HCT 74.2 (L) 59.5 - 63.8 %   MCV 100.0  80.0 - 100.0 fL   MCH 31.9 26.0 - 34.0 pg   MCHC 31.9 30.0 - 36.0 g/dL   RDW 75.6 43.3 - 29.5 %   Platelets 201 150 - 400 K/uL   nRBC 0.2 0.0 - 0.2 %    Comment: Performed at [facility], [address] [place], [place] [address]  Troponin I (High Sensitivity)     Status: None   Collection Time: 11/15/20  6:19 AM  Result Value Ref Range   Troponin I (High Sensitivity) 10 <18 ng/L    Comment: (NOTE) Elevated high sensitivity troponin I (hsTnI) values and significant  changes across serial measurements may suggest ACS but many other  chronic and acute conditions are known to elevate hsTnI results.  Refer to the "Links" section for chest pain algorithms and additional  guidance. Performed at [facility], [address] [place], [place] [address]   Comprehensive metabolic panel     Status: Abnormal   Collection Time: 11/15/20  6:19 AM  Result Value Ref Range   Sodium 141 135 - 145 mmol/L   Potassium 5.3 (H) 3.5 - 5.1 mmol/L    Comment: HEMOLYSIS AT THIS LEVEL MAY AFFECT RESULT   Chloride 108 98 - 111 mmol/L   CO2 21 (L) 22 - 32 mmol/L   Glucose,  Bld 108 (H) 70 - 99 mg/dL    Comment: Glucose reference range applies only to samples taken after fasting for at least 8 hours.   BUN 17 6 - 20 mg/dL   Creatinine, Ser 4.80 (H) 0.44 - 1.00 mg/dL   Calcium 8.8 (L) 8.9 - 10.3 mg/dL   Total Protein 6.5 6.5 - 8.1 g/dL   Albumin 3.4 (L) 3.5 - 5.0 g/dL   AST 22 15 - 41 U/L    Comment: HEMOLYSIS AT THIS LEVEL MAY AFFECT RESULT   ALT 12 0 - 44 U/L    Comment: HEMOLYSIS AT THIS LEVEL MAY AFFECT RESULT   Alkaline Phosphatase 64 38 - 126 U/L   Total Bilirubin 1.4 (H) 0.3 - 1.2 mg/dL    Comment: HEMOLYSIS AT THIS LEVEL MAY AFFECT RESULT   GFR, Estimated 54 (L) >60 mL/min    Comment: (NOTE) Calculated using the CKD-EPI Creatinine Equation (2021)    Anion gap 12 5 - 15    Comment: Performed at [facility], [address] [place], [place] [address]  Brain  natriuretic peptide     Status: Abnormal   Collection Time: 11/15/20  6:19 AM  Result Value Ref Range   B Natriuretic Peptide 188.0 (H) 0.0 - 100.0 pg/mL    Comment: Performed at [facility], [address] [place], [place] [address]  Resp Panel by RT-PCR (Flu A&B, Covid) Nasopharyngeal Swab     Status: None   Collection Time: 11/15/20  6:46 AM   Specimen: Nasopharyngeal Swab; Nasopharyngeal(NP) swabs in vial transport medium  Result Value Ref Range   SARS Coronavirus 2 by RT PCR NEGATIVE NEGATIVE    Comment: (NOTE) SARS-CoV-2 target nucleic acids are NOT DETECTED.  The SARS-CoV-2 RNA is generally detectable in upper respiratory specimens during the acute phase of infection. The lowest concentration of SARS-CoV-2 viral copies this assay can detect is 138 copies/mL. A negative result does not preclude SARS-Cov-2 infection and should not be used as the sole basis for treatment or other patient management decisions. A negative result may occur with  improper specimen collection/handling, submission of specimen other than nasopharyngeal swab, presence of viral mutation(s) within the areas targeted by this assay, and inadequate number of viral copies(<138 copies/mL). A negative result must be combined with clinical observations, patient history, and epidemiological information. The expected result is Negative.  Fact Sheet for Patients:  BloggerCourse.com  Fact Sheet for Healthcare Providers:  SeriousBroker.it  This test is no t yet approved or cleared by the [place] FDA and  has been authorized for detection and/or diagnosis of SARS-CoV-2 by FDA under an Emergency Use Authorization (EUA). This EUA will remain  in effect (meaning this test can be used) for the duration of the COVID-19 declaration under Section 564(b)(1) of the Act, 21 U.S.C.section 360bbb-3(b)(1), unless the authorization is terminated  or revoked  sooner.       Influenza A by PCR NEGATIVE NEGATIVE   Influenza B by PCR NEGATIVE NEGATIVE    Comment: (NOTE) The Xpert Xpress SARS-CoV-2/FLU/RSV plus assay is intended as an aid in the diagnosis of influenza from Nasopharyngeal swab specimens and should not be used as a sole basis for treatment. Nasal washings and aspirates are unacceptable for Xpert Xpress SARS-CoV-2/FLU/RSV testing.  Fact Sheet for Patients: BloggerCourse.com  Fact Sheet for Healthcare Providers: SeriousBroker.it  This test is not yet approved or cleared by the [place] FDA and has been authorized for detection and/or diagnosis of SARS-CoV-2 by FDA  under an Emergency Use Authorization (EUA). This EUA will remain in effect (meaning this test can be used) for the duration of the COVID-19 declaration under Section 564(b)(1) of the Act, 21 U.S.C. section 360bbb-3(b)(1), unless the authorization is terminated or revoked.  Performed at [facility], [address] [place], [place] [address]   Troponin I (High Sensitivity)     Status: None   Collection Time: 11/15/20  9:24 AM  Result Value Ref Range   Troponin I (High Sensitivity) 10 <18 ng/L    Comment: (NOTE) Elevated high sensitivity troponin I (hsTnI) values and significant  changes across serial measurements may suggest ACS but many other  chronic and acute conditions are known to elevate hsTnI results.  Refer to the "Links" section for chest pain algorithms and additional  guidance. Performed at [facility], [address] [place], [place] [address]   CBG monitoring, ED     Status: Abnormal   Collection Time: 11/15/20 12:16 PM  Result Value Ref Range   Glucose-Capillary 125 (H) 70 - 99 mg/dL    Comment: Glucose reference range applies only to samples taken after fasting for at least 8 hours.   DG Chest 1 View  Result Date: 11/15/2020 CLINICAL DATA:  Shortness of breath.  EXAM: CHEST  1 VIEW COMPARISON:  Chest x-rays dated 12/01/2016 and 05/17/2015. FINDINGS: Stable cardiomegaly. Lungs are clear. No pleural effusion or pneumothorax is seen. Osseous structures about the chest are unremarkable. IMPRESSION: No active disease. No evidence of pneumonia or pulmonary edema. Stable cardiomegaly. Electronically Signed   By: Bary Richard M.D.   On: 11/15/2020 06:39    Pending Labs Unresulted Labs (From admission, onward)     Start     Ordered   11/16/20 0500  Basic metabolic panel  Tomorrow morning,   STAT        11/15/20 1009   11/16/20 0500  CBC  Tomorrow morning,   STAT        11/15/20 1009   11/15/20 1008  Lipid panel  Once,   STAT        11/15/20 1009   11/15/20 1004  HIV Antibody (routine testing w rflx)  (HIV Antibody (Routine testing w reflex) panel)  Once,   STAT        11/15/20 1009   11/15/20 1004  Hemoglobin A1c  Once,   STAT        11/15/20 1009            Vitals/Pain Today's Vitals   11/15/20 0730 11/15/20 0922 11/15/20 1100 11/15/20 1352  BP: 138/90 124/60 121/80 131/74  Pulse: 75 95 (!) 106 90  Resp: (!) 26 20  18   Temp:    98.7 F (37.1 C)  TempSrc:    Oral  SpO2: 98% 96% 93% 95%  Weight:      Height:      PainSc:    0-No pain    Isolation Precautions No active isolations  Medications Medications  furosemide (LASIX) injection 40 mg (has no administration in time range)  acetaminophen (TYLENOL) tablet 650 mg (has no administration in time range)    Or  acetaminophen (TYLENOL) suppository 650 mg (has no administration in time range)  oxyCODONE (Oxy IR/ROXICODONE) immediate release tablet 5 mg (has no administration in time range)  docusate sodium (COLACE) capsule 100 mg (100 mg Oral Given 11/15/20 1225)  polyethylene glycol (MIRALAX / GLYCOLAX) packet 17 g (has no administration in time range)  bisacodyl (DULCOLAX) EC  tablet 5 mg (has no administration in time range)  ondansetron (ZOFRAN) tablet 4 mg (has no administration in  time range)    Or  ondansetron (ZOFRAN) injection 4 mg (has no administration in time range)  hydrALAZINE (APRESOLINE) injection 5 mg (has no administration in time range)  ipratropium-albuterol (DUONEB) 0.5-2.5 (3) MG/3ML nebulizer solution 3 mL (3 mLs Nebulization Given 11/15/20 1355)  albuterol (PROVENTIL) (2.5 MG/3ML) 0.083% nebulizer solution 2.5 mg (has no administration in time range)  enoxaparin (LOVENOX) injection 60 mg (has no administration in time range)  sodium chloride flush (NS) 0.9 % injection 3 mL (3 mLs Intravenous Given 11/15/20 1026)  nicotine (NICODERM CQ - dosed in mg/24 hours) patch 14 mg (has no administration in time range)  amitriptyline (ELAVIL) tablet 25 mg (has no administration in time range)  pregabalin (LYRICA) capsule 75 mg (75 mg Oral Given 11/15/20 1225)  QUEtiapine (SEROQUEL) tablet 200 mg (has no administration in time range)  methylPREDNISolone sodium succinate (SOLU-MEDROL) 125 mg/2 mL injection 125 mg (125 mg Intravenous Given 11/15/20 0758)  ipratropium-albuterol (DUONEB) 0.5-2.5 (3) MG/3ML nebulizer solution 6 mL (6 mLs Nebulization Given 11/15/20 0743)  furosemide (LASIX) injection 60 mg (60 mg Intravenous Given 11/15/20 0755)  doxycycline (VIBRA-TABS) tablet 100 mg (100 mg Oral Given 11/15/20 0759)    Mobility walks Low fall risk   Focused Assessments    R Recommendations: See Admitting Provider Note  Report given to:   Additional Notes:

## 2020-11-15 NOTE — ED Triage Notes (Signed)
Pt arrives pursed lip breathing on exertion, shob, crackles noted. Pt states shob. Pt with pedal edema noted.

## 2020-11-16 ENCOUNTER — Observation Stay
Admit: 2020-11-16 | Discharge: 2020-11-16 | Disposition: A | Discharge status: Home / Self Care | Payer: Commercial Managed Care - PPO | Attending: Internal Medicine | Admitting: Internal Medicine

## 2020-11-16 DIAGNOSIS — R0602 Shortness of breath: Secondary | ICD-10-CM | POA: Diagnosis not present

## 2020-11-16 LAB — CBC
HCT: 34 % — ABNORMAL LOW (ref 36.0–46.0)
Hemoglobin: 11.4 g/dL — ABNORMAL LOW (ref 12.0–15.0)
MCH: 32.4 pg (ref 26.0–34.0)
MCHC: 33.5 g/dL (ref 30.0–36.0)
MCV: 96.6 fL (ref 80.0–100.0)
Platelets: 196 10*3/uL (ref 150–400)
RBC: 3.52 MIL/uL — ABNORMAL LOW (ref 3.87–5.11)
RDW: 13.7 % (ref 11.5–15.5)
WBC: 11.8 10*3/uL — ABNORMAL HIGH (ref 4.0–10.5)
nRBC: 0 % (ref 0.0–0.2)

## 2020-11-16 LAB — BASIC METABOLIC PANEL
Anion gap: 9 (ref 5–15)
BUN: 28 mg/dL — ABNORMAL HIGH (ref 6–20)
CO2: 27 mmol/L (ref 22–32)
Calcium: 8.9 mg/dL (ref 8.9–10.3)
Chloride: 103 mmol/L (ref 98–111)
Creatinine, Ser: 1.39 mg/dL — ABNORMAL HIGH (ref 0.44–1.00)
GFR, Estimated: 46 mL/min — ABNORMAL LOW (ref >60)
Glucose, Bld: 127 mg/dL — ABNORMAL HIGH (ref 70–99)
Potassium: 3.9 mmol/L (ref 3.5–5.1)
Sodium: 139 mmol/L (ref 135–145)

## 2020-11-16 LAB — ECHOCARDIOGRAM COMPLETE
Height: 65 in
S' Lateral: 2.51 cm
Weight: 4224 oz

## 2020-11-16 LAB — HEMOGLOBIN A1C
Hgb A1c MFr Bld: 5.6 % (ref 4.8–5.6)
Mean Plasma Glucose: 114 mg/dL

## 2020-11-16 MED ORDER — IPRATROPIUM-ALBUTEROL 0.5-2.5 (3) MG/3ML IN SOLN
3.0000 mL | Freq: Three times a day (TID) | RESPIRATORY_TRACT | Status: DC
  Administered 2020-11-16 – 2020-11-17 (×4): 3 mL via RESPIRATORY_TRACT
  Filled 2020-11-16 (×4): qty 3

## 2020-11-16 MED ORDER — PREDNISONE 20 MG PO TABS
40.0000 mg | ORAL_TABLET | Freq: Every day | ORAL | Status: DC
  Administered 2020-11-16 – 2020-11-17 (×2): 40 mg via ORAL
  Filled 2020-11-16 (×2): qty 2

## 2020-11-16 NOTE — Progress Notes (Signed)
*  PRELIMINARY RESULTS* Echocardiogram 2D Echocardiogram has been performed.  Cristela Blue 11/16/2020, 8:01 AM

## 2020-11-16 NOTE — Progress Notes (Signed)
PROGRESS NOTE    Andrea Kim  OPF:[id number] DOB: 05-Dec-1970 DOA: 11/15/2020 PCP: Sandrea Hughs, NP  [phone]   Assessment & Plan:   Principal Problem:   Dyspnea Active Problems:   Asthma, chronic   Hypertension   Morbid obesity with BMI of 40.0-44.9, adult (HCC)   Tobacco dependence   Andrea Kim is a 50 y.o. female with medical history significant of HTN, morbid obesity, and asthma/bronchitis presenting with SOB.   Shortness of breath likely 2/2 Asthma or COPD exacerbation --presented with dyspnea and appeared in respiratory distress, however, no hypoxia.  No signs of PNA or pulm edema.  Echo wnl.  Pt is an active smoker and had run out of albuterol inhaler, so likely an exacerbation of asthma or undx COPD. --started on IV solumedrol, DuoNeb and IV lasix on presentation. Plan: --cont steroid as prednisone 40 mg daily --cont DuoNeb --d/c IV lasix, since no CHF or fluid overload   HTN --on lisinopril and propanolol at home --BP intermittently soft --Hold both BP meds   Mood d/o Insomnia  -on Elavil, Lyrica, Seroquel and Ambien at home --cont home regimen   Tobacco dependence --current smoker --cessation advised  Morbid Obesity -Body mass index is 43.27 kg/m..  --outpatient followup for weight loss   DVT prophylaxis: Lovenox SQ Code Status: Full code  Family Communication:  Level of care: Med-Surg Dispo:   The patient is from: home Anticipated d/c is to: home Anticipated d/c date is: tomorrow Patient currently is not medically ready to d/c due to: severe dyspnea   Subjective and Interval History:  Dyspnea improved with steroid and breathing tx.  Leg swelling improved also.  Started to have some cough.     Objective: Vitals:   11/16/20 0517 11/16/20 0521 11/16/20 0857 11/16/20 1333  BP: 110/74  (!) 128/91   Pulse: 100  96 95  Resp: 16  17 16   Temp: 98.1 F (36.7 C)  97.6 F (36.4 C)   TempSrc: Oral  Oral   SpO2:  95%  94% 93%  Weight:  119.7 kg    Height:        Intake/Output Summary (Last 24 hours) at 11/16/2020 1507 Last data filed at 11/16/2020 1411 Gross per 24 hour  Intake 1570 ml  Output 450 ml  Net 1120 ml   Filed Weights   11/15/20 0618 11/16/20 0521  Weight: 117.9 kg 119.7 kg    Examination:   Constitutional: NAD, AAOx3 HEENT: conjunctivae and lids normal, EOMI CV: No cyanosis.   RESP: normal respiratory effort, on RA Extremities: No effusions, edema in BLE SKIN: warm, dry Neuro: II - XII grossly intact.   Psych: Normal mood and affect.  Appropriate judgement and reason   Data Reviewed: I have personally reviewed following labs and imaging studies  CBC: Recent Labs  Lab 11/15/20 0619 11/16/20 0510  WBC 8.4 11.8*  HGB 11.3* 11.4*  HCT 35.4* 34.0*  MCV 100.0 96.6  PLT 201 196   Basic Metabolic Panel: Recent Labs  Lab 11/15/20 0619 11/16/20 0510  NA 141 139  K 5.3* 3.9  CL 108 103  CO2 21* 27  GLUCOSE 108* 127*  BUN 17 28*  CREATININE 1.21* 1.39*  CALCIUM 8.8* 8.9   GFR: Estimated Creatinine Clearance: 61.4 mL/min (A) (by C-G formula based on SCr of 1.39 mg/dL (H)). Liver Function Tests: Recent Labs  Lab 11/15/20 0619  AST 22  ALT 12  ALKPHOS 64  BILITOT 1.4*  PROT 6.5  ALBUMIN 3.4*  No results for input(s): LIPASE, AMYLASE in the last 168 hours. No results for input(s): AMMONIA in the last 168 hours. Coagulation Profile: No results for input(s): INR, PROTIME in the last 168 hours. Cardiac Enzymes: No results for input(s): CKTOTAL, CKMB, CKMBINDEX, TROPONINI in the last 168 hours. BNP (last 3 results) No results for input(s): PROBNP in the last 8760 hours. HbA1C: No results for input(s): HGBA1C in the last 72 hours. CBG: Recent Labs  Lab 11/15/20 1216  GLUCAP 125*   Lipid Profile: Recent Labs    11/15/20 1914  CHOL 238*  HDL 94  LDLCALC 134*  TRIG 50  CHOLHDL 2.5   Thyroid Function Tests: No results for input(s): TSH,  T4TOTAL, FREET4, T3FREE, THYROIDAB in the last 72 hours. Anemia Panel: No results for input(s): VITAMINB12, FOLATE, FERRITIN, TIBC, IRON, RETICCTPCT in the last 72 hours. Sepsis Labs: No results for input(s): PROCALCITON, LATICACIDVEN in the last 168 hours.  Recent Results (from the past 240 hour(s))  Resp Panel by RT-PCR (Flu A&B, Covid) Nasopharyngeal Swab     Status: None   Collection Time: 11/15/20  6:46 AM   Specimen: Nasopharyngeal Swab; Nasopharyngeal(NP) swabs in vial transport medium  Result Value Ref Range Status   SARS Coronavirus 2 by RT PCR NEGATIVE NEGATIVE Final    Comment: (NOTE) SARS-CoV-2 target nucleic acids are NOT DETECTED.  The SARS-CoV-2 RNA is generally detectable in upper respiratory specimens during the acute phase of infection. The lowest concentration of SARS-CoV-2 viral copies this assay can detect is 138 copies/mL. A negative result does not preclude SARS-Cov-2 infection and should not be used as the sole basis for treatment or other patient management decisions. A negative result may occur with  improper specimen collection/handling, submission of specimen other than nasopharyngeal swab, presence of viral mutation(s) within the areas targeted by this assay, and inadequate number of viral copies(<138 copies/mL). A negative result must be combined with clinical observations, patient history, and epidemiological information. The expected result is Negative.  Fact Sheet for Patients:  BloggerCourse.com  Fact Sheet for Healthcare Providers:  SeriousBroker.it  This test is no t yet approved or cleared by the [place] FDA and  has been authorized for detection and/or diagnosis of SARS-CoV-2 by FDA under an Emergency Use Authorization (EUA). This EUA will remain  in effect (meaning this test can be used) for the duration of the COVID-19 declaration under Section 564(b)(1) of the Act, 21 U.S.C.section  360bbb-3(b)(1), unless the authorization is terminated  or revoked sooner.       Influenza A by PCR NEGATIVE NEGATIVE Final   Influenza B by PCR NEGATIVE NEGATIVE Final    Comment: (NOTE) The Xpert Xpress SARS-CoV-2/FLU/RSV plus assay is intended as an aid in the diagnosis of influenza from Nasopharyngeal swab specimens and should not be used as a sole basis for treatment. Nasal washings and aspirates are unacceptable for Xpert Xpress SARS-CoV-2/FLU/RSV testing.  Fact Sheet for Patients: BloggerCourse.com  Fact Sheet for Healthcare Providers: SeriousBroker.it  This test is not yet approved or cleared by the [place] FDA and has been authorized for detection and/or diagnosis of SARS-CoV-2 by FDA under an Emergency Use Authorization (EUA). This EUA will remain in effect (meaning this test can be used) for the duration of the COVID-19 declaration under Section 564(b)(1) of the Act, 21 U.S.C. section 360bbb-3(b)(1), unless the authorization is terminated or revoked.  Performed at [facility], [address], [place], [place] [address]       Radiology Studies:  DG Chest 1 View  Result Date: 11/15/2020 CLINICAL DATA:  Shortness of breath. EXAM: CHEST  1 VIEW COMPARISON:  Chest x-rays dated 12/01/2016 and 05/17/2015. FINDINGS: Stable cardiomegaly. Lungs are clear. No pleural effusion or pneumothorax is seen. Osseous structures about the chest are unremarkable. IMPRESSION: No active disease. No evidence of pneumonia or pulmonary edema. Stable cardiomegaly. Electronically Signed   By: Bary Richard M.D.   On: 11/15/2020 06:39   ECHOCARDIOGRAM COMPLETE  Result Date: 11/16/2020    ECHOCARDIOGRAM REPORT   Patient Name:   Andrea Kim Date of Exam: 11/16/2020 Medical Rec #:  [id number]                Height:       65.0 in Accession #:    [id number]               Weight:       264.0 lb Date of Birth:  11/22/1970                  BSA:          2.226 m Patient Age:    50 years                 BP:           110/74 mmHg Patient Gender: F                        HR:           100 bpm. Exam Location:  [facility] Procedure: 2D Echo, Cardiac Doppler and Color Doppler Indications:     CHF-acute diastolic 428.31 / I50.31  History:         Patient has no prior history of Echocardiogram examinations.                  Risk Factors:Hypertension. Bronchitis.  Sonographer:     Cristela Blue Referring Phys:  [id number] JENNIFER YATES Diagnosing Phys: Arnoldo Hooker MD  Sonographer Comments: Suboptimal apical window. IMPRESSIONS  1. Left ventricular ejection fraction, by estimation, is 60 to 65%. The left ventricle has normal function. The left ventricle has no regional wall motion abnormalities. Left ventricular diastolic parameters were normal.  2. Right ventricular systolic function is normal. The right ventricular size is normal.  3. A small pericardial effusion is present. There is no evidence of cardiac tamponade.  4. The mitral valve is normal in structure. Trivial mitral valve regurgitation.  5. The aortic valve is normal in structure. Aortic valve regurgitation is not visualized. FINDINGS  Left Ventricle: Left ventricular ejection fraction, by estimation, is 60 to 65%. The left ventricle has normal function. The left ventricle has no regional wall motion abnormalities. The left ventricular internal cavity size was normal in size. There is  no left ventricular hypertrophy. Left ventricular diastolic parameters were normal. Right Ventricle: The right ventricular size is normal. No increase in right ventricular wall thickness. Right ventricular systolic function is normal. Left Atrium: Left atrial size was normal in size. Right Atrium: Right atrial size was normal in size. Pericardium: A small pericardial effusion is present. There is no evidence of cardiac tamponade. Mitral Valve: The mitral valve is normal in structure. Trivial mitral valve  regurgitation. Tricuspid Valve: The tricuspid valve is normal in structure. Tricuspid valve regurgitation is trivial. Aortic Valve: The aortic valve is normal in structure. Aortic valve regurgitation is not visualized. Pulmonic Valve: The pulmonic valve was normal in structure.  Pulmonic valve regurgitation is not visualized. Aorta: The aortic root and ascending aorta are structurally normal, with no evidence of dilitation. IAS/Shunts: No atrial level shunt detected by color flow Doppler.  LEFT VENTRICLE PLAX 2D LVIDd:         4.06 cm LVIDs:         2.51 cm LV PW:         1.46 cm LV IVS:        1.12 cm LVOT diam:     2.00 cm LVOT Area:     3.14 cm  LEFT ATRIUM         Index LA diam:    4.10 cm 1.84 cm/m                        PULMONIC VALVE AORTA                 PV Vmax:        0.60 m/s Ao Root diam: 2.83 cm PV Peak grad:   1.5 mmHg                       RVOT Peak grad: 3 mmHg   SHUNTS Systemic Diam: 2.00 cm Arnoldo Hooker MD Electronically signed by Arnoldo Hooker MD Signature Date/Time: 11/16/2020/1:14:27 PM    Final      Scheduled Meds:  amitriptyline  25 mg Oral QHS   docusate sodium  100 mg Oral BID   enoxaparin (LOVENOX) injection  60 mg Subcutaneous QHS   furosemide  40 mg Intravenous BID   influenza vac split quadrivalent PF  0.5 mL Intramuscular Tomorrow-1000   ipratropium-albuterol  3 mL Nebulization TID   predniSONE  40 mg Oral Q breakfast   pregabalin  75 mg Oral BID   QUEtiapine  200 mg Oral QHS   sodium chloride flush  3 mL Intravenous Q12H   Continuous Infusions:   LOS: 0 days     Darlin Priestly, MD [facility] If 7PM-7AM, please contact night-coverage 11/16/2020, 3:07 PM

## 2020-11-16 NOTE — Progress Notes (Signed)
Nutrition Brief Note  New consult received for "nutritional goals." Reviewed chart and intake and pt does not appear to have any acute nutrition needs. Weight is stable and pt is consuming 100% of meals.   Wt Readings from Last 15 Encounters:  11/16/20 119.7 kg  11/22/18 112.9 kg  04/13/17 109.3 kg  03/02/17 109.3 kg  11/30/16 109.3 kg  10/04/16 114.3 kg  06/28/16 114.3 kg  06/01/16 114.3 kg  01/06/16 113.4 kg  05/17/15 110.2 kg  01/12/15 114.8 kg  12/15/14 116.9 kg  11/25/14 110.2 kg  10/02/14 110.2 kg  09/16/14 114.8 kg    Body mass index is 43.93 kg/m. Patient meets criteria for obesity class III based on current BMI. If MD wishes for pt to receive weight loss/maintenance support, please refer to outpatient RD.    Current diet order is heart healthy, patient is consuming approximately 100% of meals at this time. Labs and medications reviewed.   No acute, clinical nutrition interventions warranted at this time. If nutrition issues arise, please consult RD.   Greig Castilla, RD, LDN Clinical Dietitian RD pager # available in [facility]  After hours/weekend pager # available in [facility]

## 2020-11-17 DIAGNOSIS — R0602 Shortness of breath: Secondary | ICD-10-CM | POA: Diagnosis not present

## 2020-11-17 LAB — CBC
HCT: 35.1 % — ABNORMAL LOW (ref 36.0–46.0)
Hemoglobin: 11.7 g/dL — ABNORMAL LOW (ref 12.0–15.0)
MCH: 32.1 pg (ref 26.0–34.0)
MCHC: 33.3 g/dL (ref 30.0–36.0)
MCV: 96.4 fL (ref 80.0–100.0)
Platelets: 211 10*3/uL (ref 150–400)
RBC: 3.64 MIL/uL — ABNORMAL LOW (ref 3.87–5.11)
RDW: 13.7 % (ref 11.5–15.5)
WBC: 10.4 10*3/uL (ref 4.0–10.5)
nRBC: 0 % (ref 0.0–0.2)

## 2020-11-17 LAB — BASIC METABOLIC PANEL
Anion gap: 9 (ref 5–15)
BUN: 33 mg/dL — ABNORMAL HIGH (ref 6–20)
CO2: 28 mmol/L (ref 22–32)
Calcium: 8.6 mg/dL — ABNORMAL LOW (ref 8.9–10.3)
Chloride: 104 mmol/L (ref 98–111)
Creatinine, Ser: 1.15 mg/dL — ABNORMAL HIGH (ref 0.44–1.00)
GFR, Estimated: 57 mL/min — ABNORMAL LOW (ref >60)
Glucose, Bld: 96 mg/dL (ref 70–99)
Potassium: 3.9 mmol/L (ref 3.5–5.1)
Sodium: 141 mmol/L (ref 135–145)

## 2020-11-17 LAB — MAGNESIUM: Magnesium: 1.9 mg/dL (ref 1.7–2.4)

## 2020-11-17 MED ORDER — QUETIAPINE FUMARATE 200 MG PO TABS: 200.0000 mg | ORAL_TABLET | Freq: Every day | ORAL | 0 refills | Status: AC

## 2020-11-17 MED ORDER — ALBUTEROL SULFATE HFA 108 (90 BASE) MCG/ACT IN AERS: 2.0000 | INHALATION_SPRAY | Freq: Four times a day (QID) | RESPIRATORY_TRACT | 2 refills | Status: DC | PRN

## 2020-11-17 MED ORDER — POLYETHYLENE GLYCOL 3350 17 G PO PACK: 17.0000 g | PACK | Freq: Two times a day (BID) | ORAL | 0 refills | Status: DC | PRN

## 2020-11-17 MED ORDER — PREDNISONE 20 MG PO TABS: 40.0000 mg | ORAL_TABLET | Freq: Every day | ORAL | 0 refills | Status: AC

## 2020-11-17 MED ORDER — LISINOPRIL 40 MG PO TABS: ORAL_TABLET | ORAL | Status: AC

## 2020-11-17 MED ORDER — CYCLOBENZAPRINE HCL 10 MG PO TABS: 10.0000 mg | ORAL_TABLET | Freq: Every evening | ORAL | 0 refills | Status: DC | PRN

## 2020-11-17 MED ORDER — PREDNISONE 20 MG PO TABS: 40.0000 mg | ORAL_TABLET | Freq: Every day | ORAL | 0 refills | Status: DC

## 2020-11-17 MED ORDER — ALBUTEROL SULFATE HFA 108 (90 BASE) MCG/ACT IN AERS: 2.0000 | INHALATION_SPRAY | Freq: Four times a day (QID) | RESPIRATORY_TRACT | 2 refills | Status: AC | PRN

## 2020-11-17 MED ORDER — AMITRIPTYLINE HCL 25 MG PO TABS: 25.0000 mg | ORAL_TABLET | Freq: Every day | ORAL | 0 refills | Status: AC

## 2020-11-17 MED ORDER — NICOTINE 14 MG/24HR TD PT24: 14.0000 mg | MEDICATED_PATCH | Freq: Every day | TRANSDERMAL | 0 refills | Status: DC | PRN

## 2020-11-17 MED ORDER — PROPRANOLOL HCL 80 MG PO TABS: ORAL_TABLET | ORAL | Status: AC

## 2020-11-17 MED ORDER — PREGABALIN 75 MG PO CAPS: 75.0000 mg | ORAL_CAPSULE | Freq: Two times a day (BID) | ORAL | 0 refills | Status: AC

## 2020-11-17 MED ORDER — GUAIFENESIN-DM 100-10 MG/5ML PO SYRP
5.0000 mL | ORAL_SOLUTION | ORAL | Status: DC | PRN
  Administered 2020-11-17: 5 mL via ORAL
  Filled 2020-11-17 (×2): qty 5

## 2020-11-17 MED ORDER — NICOTINE 14 MG/24HR TD PT24
14.0000 mg | MEDICATED_PATCH | Freq: Every day | TRANSDERMAL | 0 refills | Status: DC | PRN
Start: 2020-11-17 — End: 2021-05-05

## 2020-11-17 NOTE — TOC Transition Note (Signed)
Transition of Care [facility]) - CM/SW Discharge Note   Patient Details  Name: Andrea Kim MRN: [id number] Date of Birth: 13-Apr-1970  Transition of Care [facility]) CM/SW Contact:  Chapman Fitch, RN Phone Number: 11/17/2020, 10:19 AM   Clinical Narrative:     Patient to discharge home today Patient ran out of her medication due to not following up with her PCP Per [facility] PCP office to call patient with appointment MD  has ordered what medications will be needed at discharge  [facility] consult for HF screen.  Heart Failure navigator notified, however after chart review it is documented by MD that patient does not have heart failure   Final next level of care: Skilled Nursing Facility Barriers to Discharge: Continued Medical Work up   Patient Goals and CMS Choice        Discharge Placement                       Discharge Plan and Services In-house Referral: Clinical Social Work   Post Acute Care Choice: Skilled Nursing Facility                               Social Determinants of Health (SDOH) Interventions     Readmission Risk Interventions No flowsheet data found.

## 2020-11-17 NOTE — Plan of Care (Signed)
Discharge instructions reviewed, patient discharged to home with friend transporting

## 2020-11-17 NOTE — Discharge Summary (Signed)
Physician Discharge Summary   Andrea Kim  female DOB: 12-14-1970  VVO:[id number]  PCP: Sandrea Hughs, NP  Admit date: 11/15/2020 Discharge date: 11/17/2020  Admitted From: home Disposition:  home CODE STATUS: Full code  Discharge Instructions     Discharge instructions   Complete by: As directed    You have been treated for asthma and/or COPD flare up with steroid and breathing treatment.  Your heart function is normal, and there is no signs of pneumonia or fluid overload.  I have refilled your albuterol inhaler.  Please finish taking 2 more days of prednisone at home.  It's highly recommended that you stop smoking.   Dr. Darlin Priestly [facility] Course:  For full details, please see H&P, progress notes, consult notes and ancillary notes.  Briefly,  Andrea Kim is a 50 y.o. female with medical history significant of HTN, morbid obesity, and asthma/bronchitis presenting with SOB.   Shortness of breath likely 2/2 Mild Asthma or COPD exacerbation --presented with dyspnea and appeared in respiratory distress, however, no hypoxia.  No signs of PNA or pulm edema.  Echo wnl.  Pt is an active smoker and had run out of albuterol inhaler, so likely an exacerbation of asthma or undx COPD. --started on IV solumedrol, DuoNeb and IV lasix on presentation. --steroid was transitioned to prednisone 40 mg daily, and pt was discharged to finish 2 more days of prednisone.  Albuterol inhaler refilled. --IV lasix was not continued since no CHF or fluid overload. --The day after admission, pt was on room air, without wheezing, and appeared in no respiratory distress, however, pt complained she still didn't fell well, so was kept overnight and discharged the next day.   HTN --lisinopril and propanolol on home med list, though unsure if pt was taking them.   --BP intermittently soft --Hold both BP meds   Mood d/o Insomnia  -on Elavil, Lyrica, Seroquel and  Ambien at home --cont home regimen   Tobacco dependence --current smoker --cessation advised   Morbid Obesity --Body mass index is 43.27 kg/m..  --outpatient followup for weight loss   Discharge Diagnoses:  Principal Problem:   Dyspnea Active Problems:   Asthma, chronic   Hypertension   Morbid obesity with BMI of 40.0-44.9, adult (HCC)   Tobacco dependence     Discharge Instructions:  Allergies as of 11/17/2020   No Known Allergies      Medication List     TAKE these medications    albuterol 108 (90 Base) MCG/ACT inhaler Commonly known as: VENTOLIN HFA Inhale 2 puffs into the lungs every 6 (six) hours as needed for wheezing or shortness of breath.   amitriptyline 25 MG tablet Commonly known as: ELAVIL Take 1 tablet (25 mg total) by mouth at bedtime.   cyclobenzaprine 10 MG tablet Commonly known as: FLEXERIL Take 1 tablet (10 mg total) by mouth at bedtime as needed.   lisinopril 40 MG tablet Commonly known as: ZESTRIL Hold until followup with PCP since your blood pressure was normal without this. What changed:  how much to take how to take this when to take this additional instructions   nicotine 14 mg/24hr patch Commonly known as: NICODERM CQ - dosed in mg/24 hours Place 1 patch (14 mg total) onto the skin daily as needed (prn nicotine dependence).   polyethylene glycol 17 g packet Commonly known as: MIRALAX / GLYCOLAX Take 17 g by mouth 2 (two) times daily as  needed for mild constipation. Take it with a glass of fluid.   predniSONE 20 MG tablet Commonly known as: DELTASONE Take 2 tablets (40 mg total) by mouth daily with breakfast for 2 days. Start taking on: November 18, 2020   pregabalin 75 MG capsule Commonly known as: LYRICA Take 1 capsule (75 mg total) by mouth 2 (two) times daily.   propranolol 80 MG tablet Commonly known as: INDERAL Check with your PCP to see if you are still taking this. What changed:  how much to take how to  take this when to take this additional instructions   QUEtiapine 200 MG tablet Commonly known as: SEROQUEL Take 1 tablet (200 mg total) by mouth at bedtime.   zolpidem 10 MG tablet Commonly known as: AMBIEN Take 10 mg by mouth at bedtime as needed.         Follow-up Information     Sandrea Hughs, NP Follow up in 1 week(s).   Specialty: Nurse Practitioner Contact information: [address] [place] [address] [phone]                 No Known Allergies   The results of significant diagnostics from this hospitalization (including imaging, microbiology, ancillary and laboratory) are listed below for reference.   Consultations:   Procedures/Studies: DG Chest 1 View  Result Date: 11/15/2020 CLINICAL DATA:  Shortness of breath. EXAM: CHEST  1 VIEW COMPARISON:  Chest x-rays dated 12/01/2016 and 05/17/2015. FINDINGS: Stable cardiomegaly. Lungs are clear. No pleural effusion or pneumothorax is seen. Osseous structures about the chest are unremarkable. IMPRESSION: No active disease. No evidence of pneumonia or pulmonary edema. Stable cardiomegaly. Electronically Signed   By: Bary Richard M.D.   On: 11/15/2020 06:39   ECHOCARDIOGRAM COMPLETE  Result Date: 11/16/2020    ECHOCARDIOGRAM REPORT   Patient Name:   PERCY COMP Date of Exam: 11/16/2020 Medical Rec #:  [id number]                Height:       65.0 in Accession #:    [id number]               Weight:       264.0 lb Date of Birth:  April 22, 1970                 BSA:          2.226 m Patient Age:    50 years                 BP:           110/74 mmHg Patient Gender: F                        HR:           100 bpm. Exam Location:  [facility] Procedure: 2D Echo, Cardiac Doppler and Color Doppler Indications:     CHF-acute diastolic 428.31 / I50.31  History:         Patient has no prior history of Echocardiogram examinations.                  Risk Factors:Hypertension. Bronchitis.  Sonographer:     Cristela Blue  Referring Phys:  [id number] JENNIFER YATES Diagnosing Phys: Arnoldo Hooker MD  Sonographer Comments: Suboptimal apical window. IMPRESSIONS  1. Left ventricular ejection fraction, by estimation, is 60 to 65%. The left ventricle has normal function. The left ventricle  has no regional wall motion abnormalities. Left ventricular diastolic parameters were normal.  2. Right ventricular systolic function is normal. The right ventricular size is normal.  3. A small pericardial effusion is present. There is no evidence of cardiac tamponade.  4. The mitral valve is normal in structure. Trivial mitral valve regurgitation.  5. The aortic valve is normal in structure. Aortic valve regurgitation is not visualized. FINDINGS  Left Ventricle: Left ventricular ejection fraction, by estimation, is 60 to 65%. The left ventricle has normal function. The left ventricle has no regional wall motion abnormalities. The left ventricular internal cavity size was normal in size. There is  no left ventricular hypertrophy. Left ventricular diastolic parameters were normal. Right Ventricle: The right ventricular size is normal. No increase in right ventricular wall thickness. Right ventricular systolic function is normal. Left Atrium: Left atrial size was normal in size. Right Atrium: Right atrial size was normal in size. Pericardium: A small pericardial effusion is present. There is no evidence of cardiac tamponade. Mitral Valve: The mitral valve is normal in structure. Trivial mitral valve regurgitation. Tricuspid Valve: The tricuspid valve is normal in structure. Tricuspid valve regurgitation is trivial. Aortic Valve: The aortic valve is normal in structure. Aortic valve regurgitation is not visualized. Pulmonic Valve: The pulmonic valve was normal in structure. Pulmonic valve regurgitation is not visualized. Aorta: The aortic root and ascending aorta are structurally normal, with no evidence of dilitation. IAS/Shunts: No atrial level shunt detected  by color flow Doppler.  LEFT VENTRICLE PLAX 2D LVIDd:         4.06 cm LVIDs:         2.51 cm LV PW:         1.46 cm LV IVS:        1.12 cm LVOT diam:     2.00 cm LVOT Area:     3.14 cm  LEFT ATRIUM         Index LA diam:    4.10 cm 1.84 cm/m                        PULMONIC VALVE AORTA                 PV Vmax:        0.60 m/s Ao Root diam: 2.83 cm PV Peak grad:   1.5 mmHg                       RVOT Peak grad: 3 mmHg   SHUNTS Systemic Diam: 2.00 cm Arnoldo Hooker MD Electronically signed by Arnoldo Hooker MD Signature Date/Time: 11/16/2020/1:14:27 PM    Final       Labs: BNP (last 3 results) Recent Labs    11/15/20 0619  BNP 188.0*   Basic Metabolic Panel: Recent Labs  Lab 11/15/20 0619 11/16/20 0510 11/17/20 0607  NA 141 139 141  K 5.3* 3.9 3.9  CL 108 103 104  CO2 21* 27 28  GLUCOSE 108* 127* 96  BUN 17 28* 33*  CREATININE 1.21* 1.39* 1.15*  CALCIUM 8.8* 8.9 8.6*  MG  --   --  1.9   Liver Function Tests: Recent Labs  Lab 11/15/20 0619  AST 22  ALT 12  ALKPHOS 64  BILITOT 1.4*  PROT 6.5  ALBUMIN 3.4*   No results for input(s): LIPASE, AMYLASE in the last 168 hours. No results for input(s): AMMONIA in the last 168 hours. CBC: Recent Labs  Lab 11/15/20 0619 11/16/20 0510 11/17/20 0607  WBC 8.4 11.8* 10.4  HGB 11.3* 11.4* 11.7*  HCT 35.4* 34.0* 35.1*  MCV 100.0 96.6 96.4  PLT 201 196 211   Cardiac Enzymes: No results for input(s): CKTOTAL, CKMB, CKMBINDEX, TROPONINI in the last 168 hours. BNP: Invalid input(s): POCBNP CBG: Recent Labs  Lab 11/15/20 1216  GLUCAP 125*   D-Dimer No results for input(s): DDIMER in the last 72 hours. Hgb A1c Recent Labs    11/15/20 1914  HGBA1C 5.6   Lipid Profile Recent Labs    11/15/20 1914  CHOL 238*  HDL 94  LDLCALC 134*  TRIG 50  CHOLHDL 2.5   Thyroid function studies No results for input(s): TSH, T4TOTAL, T3FREE, THYROIDAB in the last 72 hours.  Invalid input(s): FREET3 Anemia work up No results for  input(s): VITAMINB12, FOLATE, FERRITIN, TIBC, IRON, RETICCTPCT in the last 72 hours. Urinalysis    Component Value Date/Time   COLORURINE YELLOW (A) 11/22/2018 1257   APPEARANCEUR CLEAR (A) 11/22/2018 1257   LABSPEC 1.018 11/22/2018 1257   PHURINE 5.0 11/22/2018 1257   GLUCOSEU NEGATIVE 11/22/2018 1257   HGBUR NEGATIVE 11/22/2018 1257   BILIRUBINUR NEGATIVE 11/22/2018 1257   KETONESUR NEGATIVE 11/22/2018 1257   PROTEINUR NEGATIVE 11/22/2018 1257   NITRITE POSITIVE (A) 11/22/2018 1257   LEUKOCYTESUR NEGATIVE 11/22/2018 1257   Sepsis Labs Invalid input(s): PROCALCITONIN,  WBC,  LACTICIDVEN Microbiology Recent Results (from the past 240 hour(s))  Resp Panel by RT-PCR (Flu A&B, Covid) Nasopharyngeal Swab     Status: None   Collection Time: 11/15/20  6:46 AM   Specimen: Nasopharyngeal Swab; Nasopharyngeal(NP) swabs in vial transport medium  Result Value Ref Range Status   SARS Coronavirus 2 by RT PCR NEGATIVE NEGATIVE Final    Comment: (NOTE) SARS-CoV-2 target nucleic acids are NOT DETECTED.  The SARS-CoV-2 RNA is generally detectable in upper respiratory specimens during the acute phase of infection. The lowest concentration of SARS-CoV-2 viral copies this assay can detect is 138 copies/mL. A negative result does not preclude SARS-Cov-2 infection and should not be used as the sole basis for treatment or other patient management decisions. A negative result may occur with  improper specimen collection/handling, submission of specimen other than nasopharyngeal swab, presence of viral mutation(s) within the areas targeted by this assay, and inadequate number of viral copies(<138 copies/mL). A negative result must be combined with clinical observations, patient history, and epidemiological information. The expected result is Negative.  Fact Sheet for Patients:  BloggerCourse.com  Fact Sheet for Healthcare Providers:   SeriousBroker.it  This test is no t yet approved or cleared by the [place] FDA and  has been authorized for detection and/or diagnosis of SARS-CoV-2 by FDA under an Emergency Use Authorization (EUA). This EUA will remain  in effect (meaning this test can be used) for the duration of the COVID-19 declaration under Section 564(b)(1) of the Act, 21 U.S.C.section 360bbb-3(b)(1), unless the authorization is terminated  or revoked sooner.       Influenza A by PCR NEGATIVE NEGATIVE Final   Influenza B by PCR NEGATIVE NEGATIVE Final    Comment: (NOTE) The Xpert Xpress SARS-CoV-2/FLU/RSV plus assay is intended as an aid in the diagnosis of influenza from Nasopharyngeal swab specimens and should not be used as a sole basis for treatment. Nasal washings and aspirates are unacceptable for Xpert Xpress SARS-CoV-2/FLU/RSV testing.  Fact Sheet for Patients: BloggerCourse.com  Fact Sheet for Healthcare Providers: SeriousBroker.it  This test is not yet approved  or cleared by the [place] and has been authorized for detection and/or diagnosis of SARS-CoV-2 by FDA under an Emergency Use Authorization (EUA). This EUA will remain in effect (meaning this test can be used) for the duration of the COVID-19 declaration under Section 564(b)(1) of the Act, 21 U.S.C. section 360bbb-3(b)(1), unless the authorization is terminated or revoked.  Performed at [facility], [address] [place], [place] [address]      Total time spend on discharging this patient, including the last patient exam, discussing the hospital stay, instructions for ongoing care as it relates to all pertinent caregivers, as well as preparing the medical discharge records, prescriptions, and/or referrals as applicable, is 40 minutes.    Darlin Priestly, MD  [facility] 11/17/2020, 9:26 AM

## 2020-11-17 NOTE — Progress Notes (Signed)
Mobility Specialist - Progress Note   11/17/20 1041  Mobility  Activity Refused mobility  Mobility performed by Mobility specialist    Pt sleeping in bed upon arrival, awakened by voice. Politely declined mobility at this time, no reason specified. Anticipating d/c this date.    Andrea Kim Mobility Specialist 11/17/20, 10:42 AM

## 2021-02-16 ENCOUNTER — Telehealth: Payer: Self-pay | Admitting: Family Medicine

## 2021-02-16 NOTE — Telephone Encounter (Signed)
Please call with test results

## 2021-02-18 ENCOUNTER — Telehealth: Payer: Self-pay | Admitting: Family Medicine

## 2021-02-18 NOTE — Telephone Encounter (Signed)
Pt would like to speak to a nurse about her STI results.  She specifically is asking for an explanation about the terms "reactive" and "non-reactive" that she sees on her results in MyChart.

## 2021-04-16 ENCOUNTER — Ambulatory Visit: Payer: Self-pay | Admitting: Advanced Practice Midwife

## 2021-04-16 ENCOUNTER — Encounter: Payer: Self-pay | Admitting: Advanced Practice Midwife

## 2021-04-16 ENCOUNTER — Other Ambulatory Visit: Payer: Self-pay

## 2021-04-16 DIAGNOSIS — Z113 Encounter for screening for infections with a predominantly sexual mode of transmission: Secondary | ICD-10-CM

## 2021-04-16 DIAGNOSIS — T7412XS Child physical abuse, confirmed, sequela: Secondary | ICD-10-CM

## 2021-04-16 DIAGNOSIS — T7412XA Child physical abuse, confirmed, initial encounter: Secondary | ICD-10-CM | POA: Insufficient documentation

## 2021-04-16 DIAGNOSIS — T7421XA Adult sexual abuse, confirmed, initial encounter: Secondary | ICD-10-CM | POA: Insufficient documentation

## 2021-04-16 DIAGNOSIS — Z62810 Personal history of physical and sexual abuse in childhood: Secondary | ICD-10-CM | POA: Insufficient documentation

## 2021-04-16 DIAGNOSIS — T7421XS Adult sexual abuse, confirmed, sequela: Secondary | ICD-10-CM

## 2021-04-16 DIAGNOSIS — F4310 Post-traumatic stress disorder, unspecified: Secondary | ICD-10-CM | POA: Insufficient documentation

## 2021-04-16 DIAGNOSIS — A539 Syphilis, unspecified: Secondary | ICD-10-CM
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
## 2021-05-05 ENCOUNTER — Emergency Department
Admission: EM | Admit: 2021-05-05 | Discharge: 2021-05-05 | Disposition: A | Discharge status: Home / Self Care | Payer: 59 | Attending: Student in an Organized Health Care Education/Training Program | Admitting: Student in an Organized Health Care Education/Training Program

## 2021-05-05 ENCOUNTER — Other Ambulatory Visit: Payer: Self-pay

## 2021-05-05 DIAGNOSIS — J45909 Unspecified asthma, uncomplicated: Secondary | ICD-10-CM | POA: Insufficient documentation

## 2021-05-05 DIAGNOSIS — F17210 Nicotine dependence, cigarettes, uncomplicated: Secondary | ICD-10-CM | POA: Diagnosis not present

## 2021-05-05 DIAGNOSIS — G43009 Migraine without aura, not intractable, without status migrainosus: Secondary | ICD-10-CM | POA: Diagnosis not present

## 2021-05-05 DIAGNOSIS — Z79899 Other long term (current) drug therapy: Secondary | ICD-10-CM | POA: Insufficient documentation

## 2021-05-05 DIAGNOSIS — I10 Essential (primary) hypertension: Secondary | ICD-10-CM | POA: Insufficient documentation

## 2021-05-05 DIAGNOSIS — R519 Headache, unspecified: Secondary | ICD-10-CM | POA: Diagnosis present

## 2021-05-05 MED ORDER — METOCLOPRAMIDE HCL 5 MG/ML IJ SOLN
10.0000 mg | Freq: Once | INTRAMUSCULAR | Status: AC
  Administered 2021-05-05: 10 mg via INTRAVENOUS
  Filled 2021-05-05: qty 2

## 2021-05-05 MED ORDER — SODIUM CHLORIDE 0.9 % IV BOLUS
1000.0000 mL | Freq: Once | INTRAVENOUS | Status: AC
  Administered 2021-05-05: 1000 mL via INTRAVENOUS

## 2021-05-05 MED ORDER — DIPHENHYDRAMINE HCL 50 MG/ML IJ SOLN
25.0000 mg | Freq: Once | INTRAMUSCULAR | Status: AC
  Administered 2021-05-05: 25 mg via INTRAVENOUS
  Filled 2021-05-05: qty 1

## 2021-05-05 MED ORDER — KETOROLAC TROMETHAMINE 30 MG/ML IJ SOLN
30.0000 mg | Freq: Once | INTRAMUSCULAR | Status: AC
  Administered 2021-05-05: 30 mg via INTRAVENOUS
  Filled 2021-05-05: qty 1

## 2021-05-05 NOTE — Discharge Instructions (Addendum)
Follow-up with your primary care provider if any continued problems.  Continue to drink lots of fluids today stay hydrated. ?

## 2021-05-05 NOTE — ED Triage Notes (Signed)
To triage via wheelchair with c/o Migraine since last night. Hx of migraines, states this one feels similar in nature. Reports light sensitivity, noise sensitivity, and blurry vision. All of which are normal and expected sx of her migraines per the pt. Also endorses nausea and vomiting.  ?

## 2021-05-05 NOTE — ED Provider Notes (Signed)
? ?[facility] ?Provider Note ? ? ? None  ?  (approximate) ? ? ?History  ? ?Migraine ? ? ?HPI ? ?Andrea Kim is a 51 y.o. female   to the ED with complaint of migraine headache.  Patient has a history of migraines and states that this is no different than her normal headache.  Patient states started last evening with light sensitivity nausea and vomiting.  Patient complains of generalized headache which did not improve during the night.  She reports it has been a long time since she has had a headache and does not take any preventative medications.  In addition to a history of migraines she has asthma, hypertension, PTSD, depression and smokes 1/2 pack cigarettes per day. ? ?  ? ? ?Physical Exam  ? ?Triage Vital Signs: ?ED Triage Vitals  ?Enc Vitals Group  ?   BP 05/05/21 0651 (!) 161/99  ?   Pulse Rate 05/05/21 0651 96  ?   Resp 05/05/21 0651 16  ?   Temp 05/05/21 0651 98.5 ?F (36.9 ?C)  ?   Temp Source 05/05/21 0651 Oral  ?   SpO2 05/05/21 0651 95 %  ?   Weight 05/05/21 0652 240 lb (108.9 kg)  ?   Height 05/05/21 0652 5\' 5"  (1.651 m)  ?   Head Circumference --   ?   Peak Flow --   ?   Pain Score 05/05/21 0652 10  ?   Pain Loc --   ?   Pain Edu? --   ?   Excl. in GC? --   ? ? ?Most recent vital signs: ?Vitals:  ? 05/05/21 0651  ?BP: (!) 161/99  ?Pulse: 96  ?Resp: 16  ?Temp: 98.5 ?F (36.9 ?C)  ?SpO2: 95%  ? ? ? ?General: Awake, no distress.  Patient with light sensitivity and continues to cover her head to protect her from the light.  No vomiting at present. ?CV:  Good peripheral perfusion.  Heart regular rate and rhythm. ?Resp:  Normal effort.  Lungs are clear bilaterally. ?Abd:  No distention.  ?Other:  PERRLA, EOMI's.  Cranial nerves II through XII grossly intact.  Grip strength grossly equal bilaterally.  Good muscle strength at 5/5.  Speech is normal. ? ? ?ED Results / Procedures / Treatments  ? ?Labs ?(all labs ordered are listed, but only abnormal results are  displayed) ?Labs Reviewed - No data to display ? ? ? ? ?PROCEDURES: ? ?Critical Care performed:  ? ?Procedures ? ? ?MEDICATIONS ORDERED IN ED: ?Medications  ?sodium chloride 0.9 % bolus 1,000 mL (0 mLs Intravenous Stopped 05/05/21 0951)  ?metoCLOPramide (REGLAN) injection 10 mg (10 mg Intravenous Given 05/05/21 0740)  ?ketorolac (TORADOL) 30 MG/ML injection 30 mg (30 mg Intravenous Given 05/05/21 0740)  ?diphenhydrAMINE (BENADRYL) injection 25 mg (25 mg Intravenous Given 05/05/21 0741)  ? ? ? ?IMPRESSION / MDM / ASSESSMENT AND PLAN / ED COURSE  ?I reviewed the triage vital signs and the nursing notes. ? ? ?Differential diagnosis includes, but is not limited to, migraine headache, cluster headache, muscle contraction headache, sinus headache. ? ?----------------------------------------- ?9:34 AM on 05/05/2021 ?----------------------------------------- ?Patient states she is feeling much better.  Is calling to get her ride to come back to pick her up. ? ?Female presents to the ED with complaint of migraine headache that started last evening and has not improved this morning.  Patient has had some nausea, vomiting, sensitivity to light and noise and blurred vision which she  states is all typical of her regular migraine.  She is not on any preventative medications.  She states has been a long time since she had a migraine.  Physical exam was reassuring and cranial nerves II through XII is grossly intact.  Patient was given IV Benadryl 25 mg, Reglan 10 mg IV and Toradol 30 mg IV.  Patient is encouraged to follow-up with her PCP if any continued problems.  Prior to discharge patient was watching TV. ? ? ? ? ? ?FINAL CLINICAL IMPRESSION(S) / ED DIAGNOSES  ? ?Final diagnoses:  ?Migraine without aura and without status migrainosus, not intractable  ? ? ? ?Rx / DC Orders  ? ?ED Discharge Orders   ? ? None  ? ?  ? ? ? ?Note:  This document was prepared using Dragon voice recognition software and may include unintentional dictation  errors. ?  ?Tommi Rumps, PA-C ?05/05/21 1043 ? ?  ?Willy Eddy, MD ?05/05/21 1045 ? ?

## 2021-10-06 ENCOUNTER — Other Ambulatory Visit: Payer: Self-pay | Admitting: Primary Care

## 2021-10-06 DIAGNOSIS — Z1231 Encounter for screening mammogram for malignant neoplasm of breast: Secondary | ICD-10-CM

## 2021-10-11 ENCOUNTER — Emergency Department: Payer: BLUE CROSS/BLUE SHIELD

## 2021-10-11 ENCOUNTER — Encounter: Payer: Self-pay | Admitting: Emergency Medicine

## 2021-10-11 ENCOUNTER — Emergency Department
Admission: EM | Admit: 2021-10-11 | Discharge: 2021-10-11 | Disposition: A | Discharge status: Home / Self Care | Payer: BLUE CROSS/BLUE SHIELD | Attending: Emergency Medicine | Admitting: Emergency Medicine

## 2021-10-11 ENCOUNTER — Other Ambulatory Visit: Payer: Self-pay

## 2021-10-11 DIAGNOSIS — J45909 Unspecified asthma, uncomplicated: Secondary | ICD-10-CM | POA: Diagnosis not present

## 2021-10-11 DIAGNOSIS — I10 Essential (primary) hypertension: Secondary | ICD-10-CM | POA: Diagnosis not present

## 2021-10-11 DIAGNOSIS — M7989 Other specified soft tissue disorders: Secondary | ICD-10-CM | POA: Diagnosis not present

## 2021-10-11 DIAGNOSIS — M25551 Pain in right hip: Secondary | ICD-10-CM | POA: Insufficient documentation

## 2021-10-11 LAB — COMPREHENSIVE METABOLIC PANEL
ALT: 12 U/L (ref 0–44)
AST: 16 U/L (ref 15–41)
Albumin: 3.6 g/dL (ref 3.5–5.0)
Alkaline Phosphatase: 60 U/L (ref 38–126)
Anion gap: 7 (ref 5–15)
BUN: 14 mg/dL (ref 6–20)
CO2: 26 mmol/L (ref 22–32)
Calcium: 8.8 mg/dL — ABNORMAL LOW (ref 8.9–10.3)
Chloride: 108 mmol/L (ref 98–111)
Creatinine, Ser: 1.14 mg/dL — ABNORMAL HIGH (ref 0.44–1.00)
GFR, Estimated: 58 mL/min — ABNORMAL LOW (ref >60)
Glucose, Bld: 123 mg/dL — ABNORMAL HIGH (ref 70–99)
Potassium: 3.9 mmol/L (ref 3.5–5.1)
Sodium: 141 mmol/L (ref 135–145)
Total Bilirubin: 1 mg/dL (ref 0.3–1.2)
Total Protein: 6.3 g/dL — ABNORMAL LOW (ref 6.5–8.1)

## 2021-10-11 LAB — CBC WITH DIFFERENTIAL/PLATELET
Abs Immature Granulocytes: 0.02 10*3/uL (ref 0.00–0.07)
Basophils Absolute: 0 10*3/uL (ref 0.0–0.1)
Basophils Relative: 0 %
Eosinophils Absolute: 0.1 10*3/uL (ref 0.0–0.5)
Eosinophils Relative: 1 %
HCT: 36.8 % (ref 36.0–46.0)
Hemoglobin: 11.3 g/dL — ABNORMAL LOW (ref 12.0–15.0)
Immature Granulocytes: 0 %
Lymphocytes Relative: 17 %
Lymphs Abs: 0.8 10*3/uL (ref 0.7–4.0)
MCH: 30.3 pg (ref 26.0–34.0)
MCHC: 30.7 g/dL (ref 30.0–36.0)
MCV: 98.7 fL (ref 80.0–100.0)
Monocytes Absolute: 0.3 10*3/uL (ref 0.1–1.0)
Monocytes Relative: 6 %
Neutro Abs: 3.7 10*3/uL (ref 1.7–7.7)
Neutrophils Relative %: 76 %
Platelets: 189 10*3/uL (ref 150–400)
RBC: 3.73 MIL/uL — ABNORMAL LOW (ref 3.87–5.11)
RDW: 13.3 % (ref 11.5–15.5)
WBC: 4.9 10*3/uL (ref 4.0–10.5)
nRBC: 0 % (ref 0.0–0.2)

## 2021-10-11 LAB — D-DIMER, QUANTITATIVE: D-Dimer, Quant: 0.44 ug/mL-FEU (ref 0.00–0.50)

## 2021-10-11 MED ORDER — KETOROLAC TROMETHAMINE 10 MG PO TABS: 10.0000 mg | ORAL_TABLET | Freq: Four times a day (QID) | ORAL | 0 refills | Status: DC | PRN

## 2021-10-11 MED ORDER — CYCLOBENZAPRINE HCL 5 MG PO TABS: 5.0000 mg | ORAL_TABLET | Freq: Three times a day (TID) | ORAL | 0 refills | Status: DC | PRN

## 2021-10-11 MED ORDER — OXYCODONE-ACETAMINOPHEN 5-325 MG PO TABS
1.0000 | ORAL_TABLET | Freq: Once | ORAL | Status: AC
  Administered 2021-10-11: 1 via ORAL
  Filled 2021-10-11: qty 1

## 2021-10-11 NOTE — ED Notes (Signed)
See triage note  Presents with lower back pain  Stats pain is in lower back and moves into both hips/legs  Denies any injury  States she was given a muscle relaxers by PCP  has not had any relief

## 2021-10-11 NOTE — ED Provider Notes (Signed)
[facility] Provider Note    Event Date/Time   First MD Initiated Contact with Patient 10/11/21 0912     (approximate)   History   Chief Complaint Hip Pain   HPI  Andrea Kim is a 51 y.o. female with past medical history of hypertension and asthma who presents to the ED complaining of hip pain.  Patient reports that she initially developed pain in her left hip a couple of weeks ago, pain has since seemed to migrate into her right hip, gradually becoming more severe.  She denies any falls or other injuries to the hip, woke up this morning with increasingly severe pain making it difficult for her to walk.  She reports increased pain with range of motion at her right hip and pain when she bears weight on her right leg.  She denies significant pain in her back and has not had any numbness or weakness in her legs.  She also denies any numbness in her groin, has not had any bowel or bladder incontinence.  She additionally reports some swelling in her legs, equal bilaterally with pain shooting down into her thighs.  She has not noticed any redness or other rash to her legs, states she has been urinating normally.     Physical Exam   Triage Vital Signs: ED Triage Vitals [10/11/21 0913]  Enc Vitals Group     BP      Pulse      Resp      Temp      Temp src      SpO2      Weight 246 lb (111.6 kg)     Height 5\' 5"  (1.651 m)     Head Circumference      Peak Flow      Pain Score 10     Pain Loc      Pain Edu?      Excl. in GC?     Most recent vital signs: Vitals:   10/11/21 0917 10/11/21 0920  BP: (!) 141/79 (!) 141/79  Pulse: 94 90  Resp: 15 15  Temp: 97.9 F (36.6 C) 97.9 F (36.6 C)  SpO2: 97% 97%    Constitutional: Alert and oriented. Eyes: Conjunctivae are normal. Head: Atraumatic. Nose: No congestion/rhinnorhea. Mouth/Throat: Mucous membranes are moist.  Cardiovascular: Normal rate, regular rhythm. Grossly normal heart sounds.   2+ radial and DP pulses bilaterally. Respiratory: Normal respiratory effort.  No retractions. Lungs CTAB. Gastrointestinal: Soft and nontender. No distention. Musculoskeletal: Diffuse tenderness to palpation at right hip with pain upon range of motion.  Trace edema to bilateral lower extremities with diffuse tenderness, no associated erythema. Neurologic:  Normal speech and language. No gross focal neurologic deficits are appreciated.    ED Results / Procedures / Treatments   Labs (all labs ordered are listed, but only abnormal results are displayed) Labs Reviewed  CBC WITH DIFFERENTIAL/PLATELET - Abnormal; Notable for the following components:      Result Value   RBC 3.73 (*)    Hemoglobin 11.3 (*)    All other components within normal limits  COMPREHENSIVE METABOLIC PANEL - Abnormal; Notable for the following components:   Glucose, Bld 123 (*)    Creatinine, Ser 1.14 (*)    Calcium 8.8 (*)    Total Protein 6.3 (*)    GFR, Estimated 58 (*)    All other components within normal limits  D-DIMER, QUANTITATIVE    RADIOLOGY Right hip x-ray reviewed and  interpreted by me with no fracture or dislocation.  PROCEDURES:  Critical Care performed: No  Procedures   MEDICATIONS ORDERED IN ED: Medications  oxyCODONE-acetaminophen (PERCOCET/ROXICET) 5-325 MG per tablet 1 tablet (1 tablet Oral Given 10/11/21 0934)     IMPRESSION / MDM / ASSESSMENT AND PLAN / ED COURSE  I reviewed the triage vital signs and the nursing notes.                              51 y.o. female with past medical history of hypertension and asthma who presents to the ED complaining of 2 weeks of pain that initially started in her left hip, has since moved to her right hip with pain shooting down her right leg and mild swelling to her lower extremities.  Patient's presentation is most consistent with acute complicated illness / injury requiring diagnostic workup.  Differential diagnosis includes, but is not  limited to, hip fracture, dislocation, lumbar radiculopathy, cauda equina, AKI, electrolyte abnormality, osteoarthritis, DVT.  Patient nontoxic-appearing and in no acute distress, vital signs are unremarkable and she is neurovascular intact to her bilateral lower extremities distally.  She has trace edema to both lower extremities with no erythema, does have diffuse tenderness to her legs with no signs of skin or soft tissue infection.  No findings to suggest septic arthritis, suspicion for DVT is low but we will screen D-dimer.  She has minimal swelling and no respiratory symptoms to suggest CHF.  We will screen x-ray for potential bony injury versus osteoarthritis, no findings to suggest cauda equina.  We will screen labs for renal failure or electrolyte abnormality contributing to mild lower extremity swelling, treat symptomatically with Percocet and reassess.  Right hip x-ray is negative for acute process, labs are reassuring with no significant anemia, leukocytosis, electrolyte abnormality, or AKI.  LFTs are also unremarkable.  D-dimer within normal limits and I doubt DVT.  Patient is appropriate for discharge home, pain seems likely due to arthritis versus muscle strain.  Patient prescribed Toradol and Flexeril for pain relief, was counseled to return to the ED for new or worsening symptoms, patient agrees with plan.      FINAL CLINICAL IMPRESSION(S) / ED DIAGNOSES   Final diagnoses:  Right hip pain  Leg swelling     Rx / DC Orders   ED Discharge Orders          Ordered    ketorolac (TORADOL) 10 MG tablet  Every 6 hours PRN        10/11/21 1127    cyclobenzaprine (FLEXERIL) 5 MG tablet  3 times daily PRN        10/11/21 1127             Note:  This document was prepared using Dragon voice recognition software and may include unintentional dictation errors.   Chesley Noon, MD 10/11/21 [phone]

## 2021-10-11 NOTE — ED Triage Notes (Signed)
Pt via POV from home. Pt states she has been intermittent hip pain for the past couple of weeks, states that it would sometimes be on the R and sometimes on the L. States that this past week was the first time it was bilateral and down her entire legs. States that legs are also swollen, states this AM she was unable to walk. Pt is A&Ox4 and NAD

## 2021-10-11 NOTE — ED Notes (Signed)
Pt to xray at this time.

## 2021-10-18 ENCOUNTER — Emergency Department
Admission: EM | Admit: 2021-10-18 | Discharge: 2021-10-18 | Disposition: A | Discharge status: Home / Self Care | Payer: BLUE CROSS/BLUE SHIELD | Attending: Emergency Medicine | Admitting: Emergency Medicine

## 2021-10-18 ENCOUNTER — Other Ambulatory Visit: Payer: Self-pay

## 2021-10-18 DIAGNOSIS — I10 Essential (primary) hypertension: Secondary | ICD-10-CM | POA: Diagnosis not present

## 2021-10-18 DIAGNOSIS — M545 Low back pain, unspecified: Secondary | ICD-10-CM | POA: Diagnosis present

## 2021-10-18 DIAGNOSIS — M5430 Sciatica, unspecified side: Secondary | ICD-10-CM | POA: Insufficient documentation

## 2021-10-18 DIAGNOSIS — J45909 Unspecified asthma, uncomplicated: Secondary | ICD-10-CM | POA: Insufficient documentation

## 2021-10-18 DIAGNOSIS — M79604 Pain in right leg: Secondary | ICD-10-CM | POA: Diagnosis not present

## 2021-10-18 MED ORDER — PREDNISONE 10 MG PO TABS: 10.0000 mg | ORAL_TABLET | Freq: Every day | ORAL | 0 refills | Status: DC

## 2021-10-18 MED ORDER — HYDROCODONE-ACETAMINOPHEN 5-325 MG PO TABS
2.0000 | ORAL_TABLET | Freq: Once | ORAL | Status: AC
  Administered 2021-10-18: 2 via ORAL
  Filled 2021-10-18: qty 2

## 2021-10-18 MED ORDER — DEXAMETHASONE SODIUM PHOSPHATE 10 MG/ML IJ SOLN
10.0000 mg | Freq: Once | INTRAMUSCULAR | Status: AC
  Administered 2021-10-18: 10 mg via INTRAMUSCULAR
  Filled 2021-10-18: qty 1

## 2021-10-18 MED ORDER — HYDROCODONE-ACETAMINOPHEN 5-325 MG PO TABS: 1.0000 | ORAL_TABLET | ORAL | 0 refills | Status: DC | PRN

## 2021-10-18 NOTE — Discharge Instructions (Signed)
Please take your steroids as prescribed for their entire course.  Please take your pain medication as needed but only as written.  Return to the emergency department for any significant weakness numbness or increased pain to the leg for any incontinence/loss of control of your bladder or bowels, or any other symptom personally concerning to yourself.

## 2021-10-18 NOTE — ED Triage Notes (Signed)
Pt to ED via POV from home. Pt reports multiple falls and right leg keeps giving out. Pt reports right hip, leg and foot pain. Pt was seen on 9/11 for same with no acute findings. Pt states she has fallen several times since last being seen.

## 2021-10-18 NOTE — ED Provider Notes (Signed)
[facility] Provider Note    Event Date/Time   First MD Initiated Contact with Patient 10/18/21 [phone]     (approximate)  History   Chief Complaint: Hip Pain, Leg Pain, and Foot Pain (Right )  HPI  Andrea Kim is a 51 y.o. female with a past medical history of asthma, hypertension, presents emergency department for lower back pain and right leg pain.  According to the patient she has been experiencing worsening right leg pain recently and states at times her right leg will give out which causes her to fall.  Patient denies any weakness.  Patient states a long history of back issues as well as sciatica in the past.  Has been told she had several bulging disks and has been recommended to have surgery but the patient has refused so far per patient.  Patient denies any headache.  Denies any weakness.  Denies any upper extremity weakness or numbness.  Physical Exam   Triage Vital Signs: ED Triage Vitals [10/18/21 0828]  Enc Vitals Group     BP 112/72     Pulse Rate 92     Resp 18     Temp 98 F (36.7 C)     Temp Source Oral     SpO2 98 %     Weight 244 lb 11.4 oz (111 kg)     Height 5\' 5"  (1.651 m)     Head Circumference      Peak Flow      Pain Score 10     Pain Loc      Pain Edu?      Excl. in GC?     Most recent vital signs: Vitals:   10/18/21 0828  BP: 112/72  Pulse: 92  Resp: 18  Temp: 98 F (36.7 C)  SpO2: 98%    General: Awake, no distress.  CV:  Good peripheral perfusion.  Regular rate and rhythm  Resp:  Normal effort.  Equal breath sounds bilaterally.  Abd:  No distention.  Soft, nontender.  No rebound or guarding. Other:  Moderate lower lumbar as well as right-sided SI joint tenderness to palpation.  Right lower extremity is neurovascularly intact.  Some pain in the lower back elicited hip flexion of the right lower extremity.   ED Results / Procedures / Treatments   MEDICATIONS ORDERED IN ED: Medications   HYDROcodone-acetaminophen (NORCO/VICODIN) 5-325 MG per tablet 2 tablet (has no administration in time range)  dexamethasone (DECADRON) injection 10 mg (has no administration in time range)     IMPRESSION / MDM / ASSESSMENT AND PLAN / ED COURSE  I reviewed the triage vital signs and the nursing notes.  Patient's presentation is most consistent with acute presentation with potential threat to life or bodily function.  Patient presents emergency department for continued lower back pain and right leg pain.  Patient states she has fallen due to the pain states her leg will give out at times when the pain hits.  States the pain starts in her lower back and goes down her right leg all the way down to the foot.  Patient has good strength on my exam, no numbness.  Does have pain in lower back with hip flexion.  Symptoms are very suggestive of sciatica.  Patient states a history of several bulging disks has been told she needs back surgery but is so far refused.  No incontinence or other red flags.  I discussed with the patient a trial of  prednisone as well as a short course of pain medication.  We will refer to orthopedics for further work-up and treatment.  The patient is agreeable to plan.  FINAL CLINICAL IMPRESSION(S) / ED DIAGNOSES   Sciatica Lower back pain   Note:  This document was prepared using Dragon voice recognition software and may include unintentional dictation errors.   Harvest Dark, MD 10/18/21 [phone]

## 2021-10-23 ENCOUNTER — Other Ambulatory Visit: Payer: Self-pay

## 2021-10-23 ENCOUNTER — Emergency Department
Admission: EM | Admit: 2021-10-23 | Discharge: 2021-10-23 | Disposition: A | Discharge status: Home / Self Care | Payer: BLUE CROSS/BLUE SHIELD | Attending: Emergency Medicine | Admitting: Emergency Medicine

## 2021-10-23 ENCOUNTER — Emergency Department: Payer: BLUE CROSS/BLUE SHIELD

## 2021-10-23 ENCOUNTER — Encounter: Payer: Self-pay | Admitting: Intensive Care

## 2021-10-23 DIAGNOSIS — J45909 Unspecified asthma, uncomplicated: Secondary | ICD-10-CM | POA: Insufficient documentation

## 2021-10-23 DIAGNOSIS — M545 Low back pain, unspecified: Secondary | ICD-10-CM | POA: Diagnosis present

## 2021-10-23 DIAGNOSIS — M5441 Lumbago with sciatica, right side: Secondary | ICD-10-CM | POA: Insufficient documentation

## 2021-10-23 DIAGNOSIS — I10 Essential (primary) hypertension: Secondary | ICD-10-CM | POA: Insufficient documentation

## 2021-10-23 MED ORDER — LIDOCAINE 5 % EX PTCH: 1.0000 | MEDICATED_PATCH | Freq: Two times a day (BID) | CUTANEOUS | 0 refills | Status: DC

## 2021-10-23 MED ORDER — LIDOCAINE 5 % EX PTCH
1.0000 | MEDICATED_PATCH | CUTANEOUS | Status: DC
  Administered 2021-10-23: 1 via TRANSDERMAL
  Filled 2021-10-23: qty 1

## 2021-10-23 NOTE — Discharge Instructions (Signed)
Follow-up with your regular doctor.  Follow-up with Dr. Cari Caraway for your back.  Take your regular medications as prescribed.

## 2021-10-23 NOTE — ED Notes (Signed)
Agreeable with previous RN , pt alert and oriented with no needs. Laying safely in bed.

## 2021-10-23 NOTE — ED Triage Notes (Signed)
Patient c/o bilateral leg pain. Reports pain starts in buttocks and radiates down to feet. Seen 10/18/21 for same and reports her knees give out due to pain

## 2021-10-23 NOTE — ED Notes (Signed)
Patient declined discharge vital signs. 

## 2021-10-23 NOTE — ED Provider Notes (Signed)
[facility] Provider Note    Event Date/Time   First MD Initiated Contact with Patient 10/23/21 1150     (approximate)   History   Leg Pain   HPI  Andrea Kim is a 51 y.o. female with history of chronic back pain, hypertension, asthma and multiple falls presents emergency department complaining of low back pain.  Patient states she keeps falling because her legs are giving out.  Continues to have low back pain.  Was placed on a steroid pack and pain medication but her symptoms have continued.  Has not had a consult with a back doctor.  Was supposed to follow-up with her primary care doctor.  States she feels like she is dropping her foot when she walks.      Physical Exam   Triage Vital Signs: ED Triage Vitals [10/23/21 1145]  Enc Vitals Group     BP 127/73     Pulse Rate 94     Resp 16     Temp 98.4 F (36.9 C)     Temp Source Oral     SpO2 98 %     Weight 243 lb (110.2 kg)     Height 5\' 5"  (1.651 m)     Head Circumference      Peak Flow      Pain Score 10     Pain Loc      Pain Edu?      Excl. in GC?     Most recent vital signs: Vitals:   10/23/21 1145  BP: 127/73  Pulse: 94  Resp: 16  Temp: 98.4 F (36.9 C)  SpO2: 98%     General: Awake, no distress.   CV:  Good peripheral perfusion. regular rate and  rhythm Resp:  Normal effort.  Abd:  No distention.   Other:  Lumbar spine tender, SI joint tender, patient walks slowly but there is no foot drop noted, 5 or 5 strength in lower extremities bilaterally, neurovascular intact   ED Results / Procedures / Treatments   Labs (all labs ordered are listed, but only abnormal results are displayed) Labs Reviewed - No data to display   EKG     RADIOLOGY CT lumbar spine    PROCEDURES:   Procedures   MEDICATIONS ORDERED IN ED: Medications  lidocaine (LIDODERM) 5 % 1 patch (has no administration in time range)     IMPRESSION / MDM / ASSESSMENT AND PLAN  / ED COURSE  I reviewed the triage vital signs and the nursing notes.                              Differential diagnosis includes, but is not limited to, cauda equina, nerve impingement, chronic back pain, sciatica  Patient's presentation is most consistent with acute complicated illness / injury requiring diagnostic workup.   CT lumbar spine ordered   CT lumbar spine independently reviewed and interpreted by me.  She has facet arthropathy and some stenosis.  Good disc space  I did explain findings to the patient.  Family member is very concerned about her drug use.  States every time she comes up.  She gets narcotics and he would prefer she not get narcotics so she will slur her speech sometimes.  He states she will take the pain medications even if she is not having pain.  He explained to him I am only giving her Lidoderm patch as  this is not a narcotic.  I will make a note in the chart for future providers.  She was discharged stable condition.   FINAL CLINICAL IMPRESSION(S) / ED DIAGNOSES   Final diagnoses:  Acute midline low back pain with right-sided sciatica     Rx / DC Orders   ED Discharge Orders          Ordered    lidocaine (LIDODERM) 5 %  Every 12 hours        10/23/21 1316             Note:  This document was prepared using Dragon voice recognition software and may include unintentional dictation errors.    Versie Starks, PA-C 10/23/21 1319    Blake Divine, MD 10/23/21 [phone]

## 2021-11-01 NOTE — Progress Notes (Signed)
Referring Physician:  Chesley Noon, MD [address] [place],  [place] [address]  Primary Physician:  Sandrea Hughs, NP  History of Present Illness: 11/01/2021 [name] is here today for f/u of her ED visits. Seen on 10/11/21, 10/18/21, and 10/23/21 for back and right leg pain with right leg giving way causing her to fall.   Pain started when making a bed on 10/11/21 and feeling a catch in her right hip. Now with constant right > left LBP with pain in right leg to her foot (entire leg). She has constant numbness in right leg from her knee to her foot. She has fallen multiple times due to this numbness.   No bowel or bladder issues.   She smokes 5 cigarettes a day.   Given toradol, flexeril, norco 5, prednisone, and lidoderm patches from ED visits.   She is currently taking flexeril, norco, and lyrica. Has not picked up lidoderm patches from pharmacy yet.   She had back pain 10+ years that improved with PT.   Conservative measures:  Physical therapy: none  Multimodal medical therapy including regular antiinflammatories: toradol, flexeril, norco 5, prednisone, and lidoderm patches.  Injections: No epidural steroid injections  Past Surgery: no lumbar surgery  Andrea Kim has no symptoms of cervical myelopathy.  The symptoms are causing a significant impact on the patient's life.   Review of Systems:  A 10 point review of systems is negative, except for the pertinent positives and negatives detailed in the HPI.  Past Medical History: Past Medical History:  Diagnosis Date   Asthma    Bronchitis    Edema    Hypertension    Morbid obesity with BMI of 40.0-44.9, adult [facility])     Past Surgical History: Past Surgical History:  Procedure Laterality Date   ABDOMINAL HYSTERECTOMY     BACK SURGERY  05/2013   etopic pregnancy      Allergies: Allergies as of 11/02/2021   (No Known Allergies)    Medications: Outpatient Encounter Medications as  of 11/02/2021  Medication Sig   albuterol (VENTOLIN HFA) 108 (90 Base) MCG/ACT inhaler Inhale 2 puffs into the lungs every 6 (six) hours as needed for wheezing or shortness of breath. (Patient not taking: Reported on 04/16/2021)   amitriptyline (ELAVIL) 25 MG tablet Take 1 tablet (25 mg total) by mouth at bedtime.   cyclobenzaprine (FLEXERIL) 5 MG tablet Take 1 tablet (5 mg total) by mouth 3 (three) times daily as needed.   HYDROcodone-acetaminophen (NORCO/VICODIN) 5-325 MG tablet Take 1 tablet by mouth every 4 (four) hours as needed.   ketorolac (TORADOL) 10 MG tablet Take 1 tablet (10 mg total) by mouth every 6 (six) hours as needed.   lidocaine (LIDODERM) 5 % Place 1 patch onto the skin every 12 (twelve) hours. Remove & Discard patch within 12 hours or as directed by MD   lisinopril (ZESTRIL) 40 MG tablet Hold until followup with PCP since your blood pressure was normal without this. (Patient not taking: Reported on 04/16/2021)   predniSONE (DELTASONE) 10 MG tablet Take 1 tablet (10 mg total) by mouth daily. Day 1-3: take 4 tablets PO daily Day 4-6: take 3 tablets PO daily Day 7-9: take 2 tablets PO daily Day 10-12: take 1 tablet PO daily   pregabalin (LYRICA) 75 MG capsule Take 1 capsule (75 mg total) by mouth 2 (two) times daily.   propranolol (INDERAL) 80 MG tablet Check with your PCP to see if you are still  taking this. (Patient not taking: Reported on 04/16/2021)   QUEtiapine (SEROQUEL) 200 MG tablet Take 1 tablet (200 mg total) by mouth at bedtime.   No facility-administered encounter medications on file as of 11/02/2021.    Social History: Social History   Tobacco Use   Smoking status: Every Day    Packs/day: 0.50    Years: 36.00    Total pack years: 18.00    Types: Cigarettes   Smokeless tobacco: Never  Vaping Use   Vaping Use: Never used  Substance Use Topics   Alcohol use: Not Currently    Alcohol/week: 2.0 standard drinks of alcohol    Types: 2 Shots of liquor per week     Comment: last use 08/2020   Drug use: No    Family Medical History: Family History  Problem Relation Age of Onset   Prostate cancer Father     Physical Examination: There were no vitals filed for this visit.  General: Patient is well developed, well nourished, calm, collected, and in no apparent distress. Attention to examination is appropriate.  Respiratory: Patient is breathing without any difficulty.   NEUROLOGICAL:     Awake, alert, oriented to person, place, and time.  Speech is clear and fluent. Fund of knowledge is appropriate.   Cranial Nerves: Pupils equal round and reactive to light.  Facial tone is symmetric.  Facial sensation is symmetric.  No abnormal lesions on exposed skin.   Strength: Side Biceps Triceps Deltoid Interossei Grip Wrist Ext. Wrist Flex.  R 5 5 5 5 5 5 5   L 5 5 5 5 5 5 5    Side Iliopsoas Quads Hamstring PF DF EHL  R 5 5 5 5 5 5   L 5 5 5 5 5 5    Reflexes are 2+ and symmetric at the biceps, triceps, brachioradialis, patella and achilles.   Hoffman's is absent.  Clonus is not present.   Bilateral upper and lower extremity sensation is intact to light touch, except sensation is decreased right leg from knee into foot.   Has pain with ROM of both hips that is diffuse in back and right knee with ROM of right hip.   Gait not tested. She is in a WC.   Medical Decision Making  Imaging: CT lumbar spine 10/23/21:  FINDINGS: Segmentation: 5 lumbar type vertebrae.   Alignment: Normal.   Vertebrae: No acute fracture or focal pathologic process.   Paraspinal and other soft tissues: Aortic atherosclerosis. No acute findings.   Disc levels: Intervertebral disc heights are preserved. No discernible disc protrusion. Moderate degenerative facet arthropathy on the right at T11-12 and L5-S1. Moderate bilateral facet arthropathy at L4-5. Foraminal stenosis is present on the right at T11-12 and L4-5. There may be mild left-sided foraminal narrowing  at L4-5. No evidence to suggest significant canal stenosis by CT.   IMPRESSION: 1. No acute fracture or traumatic malalignment of the lumbar spine. 2. Degenerative changes of the lumbar spine and lower thoracic spine, as above. 3. Aortic Atherosclerosis (ICD10-I70.0).     Electronically Signed   By: Davina Poke D.O.   On: 10/23/2021 13:00    I have personally reviewed the images and agree with the above interpretation.  Assessment and Plan: Ms. Rhody is a pleasant 51 y.o. female with  constant right > left LBP with pain in right leg to her foot (entire leg) that started on 10/11/21 when she was making a bed. She has constant numbness in right leg from her knee to  her foot.   She has known lumbar spondylosis with facet hypertrophy L4-S1 and possible right foraminal stenosis L4-L5. This could explain her LBP and possibly some of her right leg pain.   Above treatment options discussed with patient and following plan made:   - Order for physical therapy for lumbar spine  to [facility]. - Referral to pain management for consideration of lumbar injections.  - Smoking cessation discussed and encouraged.  - Continue on current medications including prn flexeril, norco, and lyrica.  - If no improvement in symptoms, consider MRI of lumbar spine.   I spent a total of 30 minutes in face-to-face and non-face-to-face activities related to this patient's care today.  Thank you for involving me in the care of this patient.   Drake Leach PA-C Dept. of Neurosurgery

## 2021-11-02 ENCOUNTER — Ambulatory Visit (INDEPENDENT_AMBULATORY_CARE_PROVIDER_SITE_OTHER): Payer: Commercial Managed Care - HMO | Admitting: Orthopedic Surgery

## 2021-11-02 ENCOUNTER — Ambulatory Visit: Payer: 59

## 2021-11-02 ENCOUNTER — Encounter: Payer: Self-pay | Admitting: Orthopedic Surgery

## 2021-11-02 VITALS — BP 155/66 | HR 89 | Ht 65.0 in | Wt 252.0 lb

## 2021-11-02 DIAGNOSIS — R202 Paresthesia of skin: Secondary | ICD-10-CM

## 2021-11-02 DIAGNOSIS — R200 Anesthesia of skin: Secondary | ICD-10-CM

## 2021-11-02 DIAGNOSIS — M47816 Spondylosis without myelopathy or radiculopathy, lumbar region: Secondary | ICD-10-CM | POA: Diagnosis not present

## 2021-11-02 NOTE — Patient Instructions (Signed)
It was so nice to see you today, I am sorry that you are hurting so much.   You have some wear and tear in your back and this is likely causing your back pain and contributing to your leg pain.   I sent physical therapy orders to the hospital [facility]), they should call you to schedule.   I put in a referral for pain management to see you and discuss possible injections. They will call you to schedule.   I will see you back in 6-8 weeks. Please do not hesitate to call if you have any questions or concerns. You can also message me in [place].   If you have not heard back about any of the tests/procedures in the next week, please call the office so we can help you get these things scheduled.   Geronimo Boot PA-C [phone]

## 2021-11-06 ENCOUNTER — Other Ambulatory Visit: Payer: Self-pay

## 2021-11-06 ENCOUNTER — Emergency Department
Admission: EM | Admit: 2021-11-06 | Discharge: 2021-11-06 | Disposition: A | Discharge status: Home / Self Care | Payer: Commercial Managed Care - HMO | Attending: Emergency Medicine | Admitting: Emergency Medicine

## 2021-11-06 DIAGNOSIS — J449 Chronic obstructive pulmonary disease, unspecified: Secondary | ICD-10-CM | POA: Insufficient documentation

## 2021-11-06 DIAGNOSIS — M5441 Lumbago with sciatica, right side: Secondary | ICD-10-CM | POA: Diagnosis not present

## 2021-11-06 DIAGNOSIS — M5431 Sciatica, right side: Secondary | ICD-10-CM

## 2021-11-06 DIAGNOSIS — M25551 Pain in right hip: Secondary | ICD-10-CM | POA: Diagnosis present

## 2021-11-06 DIAGNOSIS — R109 Unspecified abdominal pain: Secondary | ICD-10-CM | POA: Insufficient documentation

## 2021-11-06 LAB — LIPASE, BLOOD: Lipase: 31 U/L (ref 11–51)

## 2021-11-06 LAB — COMPREHENSIVE METABOLIC PANEL
ALT: 28 U/L (ref 0–44)
AST: 18 U/L (ref 15–41)
Albumin: 4 g/dL (ref 3.5–5.0)
Alkaline Phosphatase: 67 U/L (ref 38–126)
Anion gap: 11 (ref 5–15)
BUN: 24 mg/dL — ABNORMAL HIGH (ref 6–20)
CO2: 22 mmol/L (ref 22–32)
Calcium: 9 mg/dL (ref 8.9–10.3)
Chloride: 106 mmol/L (ref 98–111)
Creatinine, Ser: 1.28 mg/dL — ABNORMAL HIGH (ref 0.44–1.00)
GFR, Estimated: 50 mL/min — ABNORMAL LOW (ref >60)
Glucose, Bld: 112 mg/dL — ABNORMAL HIGH (ref 70–99)
Potassium: 3.9 mmol/L (ref 3.5–5.1)
Sodium: 139 mmol/L (ref 135–145)
Total Bilirubin: 0.7 mg/dL (ref 0.3–1.2)
Total Protein: 7.3 g/dL (ref 6.5–8.1)

## 2021-11-06 LAB — CBC
HCT: 39.9 % (ref 36.0–46.0)
Hemoglobin: 12.4 g/dL (ref 12.0–15.0)
MCH: 30.5 pg (ref 26.0–34.0)
MCHC: 31.1 g/dL (ref 30.0–36.0)
MCV: 98.3 fL (ref 80.0–100.0)
Platelets: 238 10*3/uL (ref 150–400)
RBC: 4.06 MIL/uL (ref 3.87–5.11)
RDW: 14.6 % (ref 11.5–15.5)
WBC: 6.4 10*3/uL (ref 4.0–10.5)
nRBC: 0 % (ref 0.0–0.2)

## 2021-11-06 MED ORDER — METHOCARBAMOL 500 MG PO TABS: 500.0000 mg | ORAL_TABLET | Freq: Three times a day (TID) | ORAL | 1 refills | Status: DC | PRN

## 2021-11-06 MED ORDER — LIDOCAINE 5 % EX PTCH: 1.0000 | MEDICATED_PATCH | Freq: Two times a day (BID) | CUTANEOUS | 0 refills | Status: DC

## 2021-11-06 MED ORDER — LIDOCAINE 5 % EX PTCH
1.0000 | MEDICATED_PATCH | CUTANEOUS | Status: DC
  Administered 2021-11-06: 1 via TRANSDERMAL
  Filled 2021-11-06: qty 1

## 2021-11-06 MED ORDER — METHOCARBAMOL 500 MG PO TABS
500.0000 mg | ORAL_TABLET | Freq: Once | ORAL | Status: AC
  Administered 2021-11-06: 500 mg via ORAL
  Filled 2021-11-06: qty 1

## 2021-11-06 MED ORDER — ACETAMINOPHEN 500 MG PO TABS
1000.0000 mg | ORAL_TABLET | Freq: Once | ORAL | Status: AC
  Administered 2021-11-06: 1000 mg via ORAL
  Filled 2021-11-06: qty 2

## 2021-11-06 NOTE — ED Provider Notes (Signed)
[facility] Provider Note    Event Date/Time   First MD Initiated Contact with Patient 11/06/21 [phone]     (approximate)   History   Abdominal Pain   HPI  Andrea Kim is a 51 y.o. female who presents to the ED for evaluation of Abdominal Pain   I reviewed a neurosurgical clinic visit from 4 days ago.  Seen multiple times for right-sided sciatica.  She is otherwise morbidly obese and has a history of COPD.  Patient presents to the ED for acute on chronic pain to her right hip and side.  She reports that they ordered physical therapy when she saw neurosurgery recently in the clinic but has not started this.  Reports using a cream with no improvement.  Denies any emesis, fevers, stool changes or urinary changes.  Physical Exam   Triage Vital Signs: ED Triage Vitals  Enc Vitals Group     BP 11/06/21 0257 (!) 104/90     Pulse Rate 11/06/21 0257 99     Resp 11/06/21 0257 16     Temp 11/06/21 0257 98.5 F (36.9 C)     Temp Source 11/06/21 0257 Oral     SpO2 11/06/21 0257 98 %     Weight 11/06/21 0258 246 lb (111.6 kg)     Height 11/06/21 0258 5\' 5"  (1.651 m)     Head Circumference --      Peak Flow --      Pain Score 11/06/21 0258 10     Pain Loc --      Pain Edu? --      Excl. in [place]? --     Most recent vital signs: Vitals:   11/06/21 0257 11/06/21 0421  BP: (!) 104/90 126/62  Pulse: 99 74  Resp: 16 20  Temp: 98.5 F (36.9 C)   SpO2: 98% 98%    General: Awake, no distress.  CV:  Good peripheral perfusion.  Resp:  Normal effort.  Abd:  No distention.  Soft and benign MSK:  No deformity noted.  Right lumbar tenderness with associated tenderness along her right hip and proximal thigh.  No overlying signs of trauma or skin changes. Neuro:  No focal deficits appreciated. Other:     ED Results / Procedures / Treatments   Labs (all labs ordered are listed, but only abnormal results are displayed) Labs Reviewed  COMPREHENSIVE  METABOLIC PANEL - Abnormal; Notable for the following components:      Result Value   Glucose, Bld 112 (*)    BUN 24 (*)    Creatinine, Ser 1.28 (*)    GFR, Estimated 50 (*)    All other components within normal limits  LIPASE, BLOOD  CBC    EKG   RADIOLOGY   Official radiology report(s): No results found.  PROCEDURES and INTERVENTIONS:  Procedures  Medications  lidocaine (LIDODERM) 5 % 1 patch (1 patch Transdermal Patch Applied 11/06/21 0449)  acetaminophen (TYLENOL) tablet 1,000 mg (1,000 mg Oral Given 11/06/21 0448)  methocarbamol (ROBAXIN) tablet 500 mg (500 mg Oral Given 11/06/21 0448)     IMPRESSION / MDM / ASSESSMENT AND PLAN / ED COURSE  I reviewed the triage vital signs and the nursing notes.  Differential diagnosis includes, but is not limited to, diverticulitis, appendicitis, chronic pain, sciatica, cauda equina  {Patient presents with symptoms of an acute illness or injury that is potentially life-threatening.  51 year old woman presents with acute on chronic atraumatic sciatica pain.  She  was triaged as having abdominal pain, but she does not have any intra-abdominal pain, tenderness or GI symptoms.  Doubt primary intra-abdominal pathology.  Blood work with normal CBC and lipase.  Metabolic panel with CKD around baseline.  We will provide some nonnarcotic multimodal analgesia and discharged with close return precautions.       FINAL CLINICAL IMPRESSION(S) / ED DIAGNOSES   Final diagnoses:  Sciatica of right side     Rx / DC Orders   ED Discharge Orders          Ordered    lidocaine (LIDODERM) 5 %  Every 12 hours        11/06/21 0517    methocarbamol (ROBAXIN) 500 MG tablet  Every 8 hours PRN        11/06/21 0517             Note:  This document was prepared using Dragon voice recognition software and may include unintentional dictation errors.   Vladimir Crofts, MD 11/06/21 [phone]

## 2021-11-06 NOTE — ED Triage Notes (Signed)
Pt reports RLQ abdominal pain that radiates down her right leg, onset 2 weeks ago, associated with nausea. She reports she was seen here last month for similar complaint of leg pain but the pain returned tonight and was worse.

## 2021-11-06 NOTE — Discharge Instructions (Addendum)
Use Tylenol for pain and fevers.  Up to 1000 mg per dose, up to 4 times per day.  Do not take more than 4000 mg of Tylenol/acetaminophen within 24 hours..  Please use lidocaine patches at your site of pain.  Apply 1 patch at a time, leave on for 12 hours, then remove for 12 hours.  12 hours on, 12 hours off.  Do not apply more than 1 patch at a time.  Use Robaxin muscle relaxer 3-4 times per day.

## 2021-11-28 ENCOUNTER — Emergency Department: Payer: Commercial Managed Care - HMO

## 2021-11-28 ENCOUNTER — Encounter: Payer: Self-pay | Admitting: Emergency Medicine

## 2021-11-28 ENCOUNTER — Emergency Department
Admission: EM | Admit: 2021-11-28 | Discharge: 2021-11-28 | Disposition: A | Discharge status: Home / Self Care | Payer: Commercial Managed Care - HMO | Attending: Emergency Medicine | Admitting: Emergency Medicine

## 2021-11-28 DIAGNOSIS — R200 Anesthesia of skin: Secondary | ICD-10-CM

## 2021-11-28 DIAGNOSIS — G43001 Migraine without aura, not intractable, with status migrainosus: Secondary | ICD-10-CM | POA: Diagnosis not present

## 2021-11-28 DIAGNOSIS — Z1152 Encounter for screening for COVID-19: Secondary | ICD-10-CM | POA: Diagnosis not present

## 2021-11-28 DIAGNOSIS — R2990 Unspecified symptoms and signs involving the nervous system: Secondary | ICD-10-CM | POA: Diagnosis not present

## 2021-11-28 LAB — CBC
HCT: 38.8 % (ref 36.0–46.0)
Hemoglobin: 11.9 g/dL — ABNORMAL LOW (ref 12.0–15.0)
MCH: 30.1 pg (ref 26.0–34.0)
MCHC: 30.7 g/dL (ref 30.0–36.0)
MCV: 98.2 fL (ref 80.0–100.0)
Platelets: 219 10*3/uL (ref 150–400)
RBC: 3.95 MIL/uL (ref 3.87–5.11)
RDW: 14.7 % (ref 11.5–15.5)
WBC: 5.8 10*3/uL (ref 4.0–10.5)
nRBC: 0 % (ref 0.0–0.2)

## 2021-11-28 LAB — COMPREHENSIVE METABOLIC PANEL
ALT: 18 U/L (ref 0–44)
AST: 16 U/L (ref 15–41)
Albumin: 3.8 g/dL (ref 3.5–5.0)
Alkaline Phosphatase: 62 U/L (ref 38–126)
Anion gap: 7 (ref 5–15)
BUN: 16 mg/dL (ref 6–20)
CO2: 24 mmol/L (ref 22–32)
Calcium: 8.9 mg/dL (ref 8.9–10.3)
Chloride: 113 mmol/L — ABNORMAL HIGH (ref 98–111)
Creatinine, Ser: 0.86 mg/dL (ref 0.44–1.00)
GFR, Estimated: >60 mL/min (ref >60)
Glucose, Bld: 96 mg/dL (ref 70–99)
Potassium: 3.8 mmol/L (ref 3.5–5.1)
Sodium: 144 mmol/L (ref 135–145)
Total Bilirubin: 0.6 mg/dL (ref 0.3–1.2)
Total Protein: 6.7 g/dL (ref 6.5–8.1)

## 2021-11-28 LAB — DIFFERENTIAL
Abs Immature Granulocytes: 0.04 10*3/uL (ref 0.00–0.07)
Basophils Absolute: 0 10*3/uL (ref 0.0–0.1)
Basophils Relative: 1 %
Eosinophils Absolute: 0.1 10*3/uL (ref 0.0–0.5)
Eosinophils Relative: 1 %
Immature Granulocytes: 1 %
Lymphocytes Relative: 27 %
Lymphs Abs: 1.6 10*3/uL (ref 0.7–4.0)
Monocytes Absolute: 0.4 10*3/uL (ref 0.1–1.0)
Monocytes Relative: 7 %
Neutro Abs: 3.7 10*3/uL (ref 1.7–7.7)
Neutrophils Relative %: 63 %

## 2021-11-28 LAB — URINE DRUG SCREEN, QUALITATIVE (ARMC ONLY)
Amphetamines, Ur Screen: NOT DETECTED
Barbiturates, Ur Screen: NOT DETECTED
Benzodiazepine, Ur Scrn: NOT DETECTED
Cannabinoid 50 Ng, Ur ~~LOC~~: NOT DETECTED
Cocaine Metabolite,Ur ~~LOC~~: NOT DETECTED
MDMA (Ecstasy)Ur Screen: NOT DETECTED
Methadone Scn, Ur: NOT DETECTED
Opiate, Ur Screen: NOT DETECTED
Phencyclidine (PCP) Ur S: NOT DETECTED
Tricyclic, Ur Screen: POSITIVE — AB

## 2021-11-28 LAB — URINALYSIS, ROUTINE W REFLEX MICROSCOPIC
Bilirubin Urine: NEGATIVE
Glucose, UA: NEGATIVE mg/dL
Hgb urine dipstick: NEGATIVE
Ketones, ur: NEGATIVE mg/dL
Leukocytes,Ua: NEGATIVE
Nitrite: NEGATIVE
Protein, ur: 30 mg/dL — AB
Specific Gravity, Urine: 1.033 — ABNORMAL HIGH (ref 1.005–1.030)
pH: 5 (ref 5.0–8.0)

## 2021-11-28 LAB — RESP PANEL BY RT-PCR (FLU A&B, COVID) ARPGX2
Influenza A by PCR: NEGATIVE
Influenza B by PCR: NEGATIVE
SARS Coronavirus 2 by RT PCR: NEGATIVE

## 2021-11-28 LAB — APTT: aPTT: 30 seconds (ref 24–36)

## 2021-11-28 LAB — PROTIME-INR
INR: 0.9 (ref 0.8–1.2)
Prothrombin Time: 12.5 seconds (ref 11.4–15.2)

## 2021-11-28 LAB — ETHANOL: Alcohol, Ethyl (B): <10 mg/dL (ref <10)

## 2021-11-28 LAB — CBG MONITORING, ED: Glucose-Capillary: 94 mg/dL (ref 70–99)

## 2021-11-28 MED ORDER — PROCHLORPERAZINE EDISYLATE 10 MG/2ML IJ SOLN
10.0000 mg | Freq: Once | INTRAMUSCULAR | Status: AC
  Administered 2021-11-28: 10 mg via INTRAVENOUS
  Filled 2021-11-28: qty 2

## 2021-11-28 MED ORDER — DIPHENHYDRAMINE HCL 50 MG/ML IJ SOLN
12.5000 mg | Freq: Once | INTRAMUSCULAR | Status: AC
  Administered 2021-11-28: 12.5 mg via INTRAVENOUS
  Filled 2021-11-28: qty 1

## 2021-11-28 MED ORDER — KETOROLAC TROMETHAMINE 15 MG/ML IJ SOLN
15.0000 mg | Freq: Once | INTRAMUSCULAR | Status: AC
  Administered 2021-11-28: 15 mg via INTRAVENOUS
  Filled 2021-11-28: qty 1

## 2021-11-28 MED ORDER — IPRATROPIUM-ALBUTEROL 0.5-2.5 (3) MG/3ML IN SOLN
3.0000 mL | Freq: Once | RESPIRATORY_TRACT | Status: AC
  Administered 2021-11-28: 3 mL via RESPIRATORY_TRACT
  Filled 2021-11-28: qty 3

## 2021-11-28 MED ORDER — SODIUM CHLORIDE 0.9 % IV BOLUS
1000.0000 mL | Freq: Once | INTRAVENOUS | Status: AC
  Administered 2021-11-28: 1000 mL via INTRAVENOUS

## 2021-11-28 MED ORDER — DEXAMETHASONE SODIUM PHOSPHATE 10 MG/ML IJ SOLN
6.0000 mg | Freq: Once | INTRAMUSCULAR | Status: AC
  Administered 2021-11-28: 6 mg via INTRAVENOUS
  Filled 2021-11-28: qty 1

## 2021-11-28 NOTE — ED Triage Notes (Signed)
Pt with right sided facial numbness that began sometime between 830 and 10 this morning.  Pt states she now feels numbness into her right breast.

## 2021-11-28 NOTE — ED Notes (Signed)
CBG 94 

## 2021-11-28 NOTE — ED Notes (Signed)
Pt given breathing treatment.  Tolerating well.

## 2021-11-28 NOTE — ED Notes (Addendum)
Brought to ED room 3 with neurologist who came to see pt in [place]. Neurologist arrived in [place] at 1538. Pt has R facial numbness, R arm numbness and no motor weakness. Pt has chronic neurological issues: lumbar pain, sciatic nerve pain and chronic numbness to RLE, and hx migraines. Pt will have MRI. Husband at bedside. Pt continues to be alert and oriented. Code stroke will be canceled.

## 2021-11-28 NOTE — ED Notes (Signed)
Pt medications given. Pt tolerated well.

## 2021-11-28 NOTE — ED Notes (Addendum)
Neuro cart in room and RN Malachy Mood on screen and all information provided at that time.

## 2021-11-28 NOTE — ED Provider Notes (Signed)
[facility] Provider Note    Event Date/Time   First MD Initiated Contact with Patient 11/28/21 1553     (approximate)   History   Code Stroke   HPI  Andrea Kim is a 51 y.o. female who comes in complaining of numbness on the right side of the body.  Especially her face.  Started this morning after development of a migraine headache.  Patient has had headaches similar to this before but has never had any numbness or weakness with them.  Patient was seen in [place] by neurology.  She is felt to be out of the window for tPA and felt to have a complex migraine.      Physical Exam   Triage Vital Signs: ED Triage Vitals  Enc Vitals Group     BP 11/28/21 1521 136/79     Pulse Rate 11/28/21 1548 87     Resp 11/28/21 1518 18     Temp 11/28/21 1518 98.3 F (36.8 C)     Temp Source 11/28/21 1518 Oral     SpO2 11/28/21 1518 98 %     Weight --      Height --      Head Circumference --      Peak Flow --      Pain Score --      Pain Loc --      Pain Edu? --      Excl. in [place]? --     Most recent vital signs: Vitals:   11/28/21 1830 11/28/21 1852  BP: (!) 167/74 118/81  Pulse: 93 90  Resp: 15 16  Temp:  97.8 F (36.6 C)  SpO2: 96% 96%    General: Awake, no distress.  Head normocephalic atraumatic Eyes pupils equal round reactive extract movements intact CV:  Good peripheral perfusion.  Heart regular rate and rhythm no audible murmurs Resp:  Normal effort.  Lungs with diffuse wheezing no crackles Abd:  No distention.  Soft and nontender Neuro exam patient reports continuation of the numbness especially the right side of the face although she says the whole body was somewhat numb on the right side.  Neurology noted that the right leg has been numb from sciatica for some time.   ED Results / Procedures / Treatments   Labs (all labs ordered are listed, but only abnormal results are displayed) Labs Reviewed  CBC - Abnormal; Notable for  the following components:      Result Value   Hemoglobin 11.9 (*)    All other components within normal limits  COMPREHENSIVE METABOLIC PANEL - Abnormal; Notable for the following components:   Chloride 113 (*)    All other components within normal limits  URINE DRUG SCREEN, QUALITATIVE ([facility] ONLY) - Abnormal; Notable for the following components:   Tricyclic, Ur Screen POSITIVE (*)    All other components within normal limits  URINALYSIS, ROUTINE W REFLEX MICROSCOPIC - Abnormal; Notable for the following components:   Color, Urine YELLOW (*)    APPearance HAZY (*)    Specific Gravity, Urine 1.033 (*)    Protein, ur 30 (*)    Bacteria, UA RARE (*)    All other components within normal limits  RESP PANEL BY RT-PCR (FLU A&B, COVID) ARPGX2  PROTIME-INR  APTT  DIFFERENTIAL  ETHANOL  POC URINE PREG, ED  CBG MONITORING, ED     EKG  EKG read and interpreted by me shows sinus tachycardia rate of 106 Normal axis  patient has very frequent PVCs 2 types of PVCs in fact. Previous EKGs have shown PVCs but never 2 different types. RADIOLOGY  CT of the head read by radiology reviewed by neurology and reviewed by me is negative.  PROCEDURES:  Critical Care performed:   Procedures   MEDICATIONS ORDERED IN ED: Medications  sodium chloride 0.9 % bolus 1,000 mL (0 mLs Intravenous Stopped 11/28/21 1901)  prochlorperazine (COMPAZINE) injection 10 mg (10 mg Intravenous Given 11/28/21 1630)  diphenhydrAMINE (BENADRYL) injection 12.5 mg (12.5 mg Intravenous Given 11/28/21 1633)  ketorolac (TORADOL) 15 MG/ML injection 15 mg (15 mg Intravenous Given 11/28/21 1628)  dexamethasone (DECADRON) injection 6 mg (6 mg Intravenous Given 11/28/21 1623)  ipratropium-albuterol (DUONEB) 0.5-2.5 (3) MG/3ML nebulizer solution 3 mL (3 mLs Nebulization Given 11/28/21 1648)     IMPRESSION / MDM / ASSESSMENT AND PLAN / ED COURSE  I reviewed the triage vital signs and the nursing  notes. ----------------------------------------- 4:38 PM on 11/28/2021 ----------------------------------------- I had discussed the patient with neurology who informed me of her findings.  She asked that we give the patient a migraine cocktail which I have ordered.  Patient is looking quite well at this point she still does have a headache and numbness.  MRI was scheduled by neurology and should be occurring here in the next 15 minutes or so.  Plan is to discharge her with outpatient follow-up if she is negative on the MRI and admit for stroke work-up if she is positive.  Differential diagnosis includes, but is not limited to, CVA, complex migraine, malingering last is very unlikely.  Patient's presentation is most consistent with acute presentation with potential threat to life or bodily function.  The patient is on the cardiac monitor to evaluate for evidence of arrhythmia and/or significant heart rate changes.  None were seen  Patient's MRI was negative.  Headache and numbness resolved together after her migraine cocktail.  This is all consistent with a complex migraine.  She will follow-up with outpatient neurology as our hospital neurologist recommended.  FINAL CLINICAL IMPRESSION(S) / ED DIAGNOSES   Final diagnoses:  Numbness  Migraine without aura and with status migrainosus, not intractable     Rx / DC Orders   ED Discharge Orders     None        Note:  This document was prepared using Dragon voice recognition software and may include unintentional dictation errors.   Nena Polio, MD 11/28/21 [phone]

## 2021-11-28 NOTE — Consult Note (Signed)
Neurology Consultation Reason for Consult: Code stroke Requesting Physician: Conni Slipper  CC: Right facial and arm tingling, headache  History is obtained from: Patient and chart review  HPI: Andrea Kim is a 51 y.o. female with a past medical history significant for migraine headaches, hypertension, BMI greater than 40, asthma, bronchitis, tobacco abuse, syphilis (treated 03/20/2021), PTSD, sciatica.  She reports she has been in her normal state of health recently except increased cough and phlegm lately.  This morning when she woke up at 8:30 AM she noticed some tingling in her right eye.  She went back to sleep apnea and would feel better but given that it continued to progress she came to the ED for concern she may be having a stroke as she has a family history of the same.  This is associated with headache that has her typical migraine features (pulsating pain on the right side especially involving the right eye light sensitivity), but she has not had any neurological symptoms with these headaches in the past   LKW: 10/28 evening Thrombolytic given?: No, out of the window  IA performed?:  Exam not consistent with an LVO Premorbid modified rankin scale:      2 - Slight disability. Able to look after own affairs without assistance, but unable to carry out all previous activities. (Sciatica pain requires assistance of cane/walker for ambulation)  ROS: All other review of systems was negative except as noted in the HPI.   Past Medical History:  Diagnosis Date   Asthma    Bronchitis    Edema    Hypertension    Morbid obesity with BMI of 40.0-44.9, adult [facility])    Past Surgical History:  Procedure Laterality Date   ABDOMINAL HYSTERECTOMY     BACK SURGERY  05/2013   etopic pregnancy     Not yet verified by pharmacy Current Outpatient Medications  Medication Instructions   albuterol (VENTOLIN HFA) 108 (90 Base) MCG/ACT inhaler 2 puffs, Inhalation, Every 6 hours PRN    amitriptyline (ELAVIL) 25 mg, Oral, Daily at bedtime   cyclobenzaprine (FLEXERIL) 5 mg, Oral, 3 times daily PRN   HYDROcodone-acetaminophen (NORCO/VICODIN) 5-325 MG tablet 1 tablet, Oral, Every 4 hours PRN   ketorolac (TORADOL) 10 mg, Oral, Every 6 hours PRN   lidocaine (LIDODERM) 5 % 1 patch, Transdermal, Every 12 hours, Remove & Discard patch within 12 hours or as directed by MD   lidocaine (LIDODERM) 5 % 1 patch, Transdermal, Every 12 hours, Remove & Discard patch within 12 hours or as directed by MD   lisinopril (ZESTRIL) 40 MG tablet Hold until followup with PCP since your blood pressure was normal without this.   methocarbamol (ROBAXIN) 500 mg, Oral, Every 8 hours PRN   pregabalin (LYRICA) 75 mg, Oral, 2 times daily   propranolol (INDERAL) 80 MG tablet Check with your PCP to see if you are still taking this.   QUEtiapine (SEROQUEL) 200 mg, Oral, Daily at bedtime   Family History  Problem Relation Age of Onset   Prostate cancer Father    Social History:  reports that she has been smoking cigarettes. She has a 18.00 pack-year smoking history. She has never used smokeless tobacco. She reports that she does not currently use alcohol after a past usage of about 2.0 standard drinks of alcohol per week. She reports that she does not use drugs.   Exam: Current vital signs: BP (!) 136/103   Pulse 87   Temp 98.3 F (36.8 C) (Oral)  Resp 16   SpO2 100%  Vital signs in last 24 hours: Temp:  [98.3 F (36.8 C)] 98.3 F (36.8 C) (10/29 1518) Pulse Rate:  [87] 87 (10/29 1551) Resp:  [16-18] 16 (10/29 1548) BP: (136)/(79-103) 136/103 (10/29 1548) SpO2:  [98 %-100 %] 100 % (10/29 1551)   Physical Exam  Constitutional: Appears well-developed and well-nourished.  Psych: Affect appropriate to situation, pleasant and cooperative Eyes: No scleral injection HENT: No oropharyngeal obstruction.  MSK: no joint deformities.  Cardiovascular: Normal rate and regular rhythm. Perfusing extremities  well Respiratory: Effort normal, non-labored breathing, no cough during my assessment GI: Soft.  No distension. There is no tenderness.  Skin: Warm dry and intact visible skin  Neuro: Mental Status: Patient is awake, alert, oriented to person, place, month, year, and situation. Patient is able to give a clear and coherent history. No signs of aphasia or neglect Cranial Nerves: II: Visual Fields are full (intermittently incorrect RUQ but not consistently). Pupils are equal, round, and reactive to light.   III,IV, VI: EOMI without ptosis or diploplia.  V: Facial sensation is symmetric to temperature VII: Facial movement is symmetric.  VIII: hearing is intact to voice X: Uvula elevates symmetrically XII: tongue is midline without atrophy or fasciculations.  Motor: Tone is normal. Bulk is normal. No pronator drift, fine tremor RUE Sensory: Sensation splits midline on the forehead with vibration, reduced to light touch right face and arm (new), reduced in RLE (chronic from sciatica) Cerebellar: FNF and HKS are intact bilaterally within limits of pain Gait:  Antalgic secondary to sciatica, uses cane/walker at baseline  NIHSS total 1 Score breakdown: sensory loss right arm and face Performed at 3:36 PM   I have reviewed labs in epic and the results pertinent to this consultation are:  Basic Metabolic Panel: Recent Labs  Lab 11/28/21 1524  NA 144  K 3.8  CL 113*  CO2 24  GLUCOSE 96  BUN 16  CREATININE 0.86  CALCIUM 8.9    CBC: Recent Labs  Lab 11/28/21 1524  WBC 5.8  NEUTROABS 3.7  HGB 11.9*  HCT 38.8  MCV 98.2  PLT 219    Coagulation Studies: Recent Labs    11/28/21 1524  LABPROT 12.5  INR 0.9      I have reviewed the images obtained:  Head CT personally reviewed, agree with radiology, no acute intracranial process   Impression: The history provided is most consistent with migraine and cortical spreading depression causing facial symptoms rather than  acute stroke.  However given the patient's risk factors, and the fact that she has never had the symptoms with a migraine before it is reasonable to obtain MRI brain to exclude acute intracranial process.  She reports that her migraines typically respond to the cocktails that she gets in the ED; appreciate treatment of her headache by ED provider as well as assessment of her cough/bronchitis complaints  Recommendations: - Migraine cocktail per ED - MRI brain w/o, outpatient follow-up if negative is appropriate from a neurological perspective - Stroke workup if positive (CTA head and neck, ECHO, A1c, lipids, telemetry, neurochecks, aspirin 325, plavix 300 followed by 81 and 75 mg daily course TBD pending vessel imaging) - Workup of cough per EDP   Lesleigh Noe MD-PhD [facility] [phone] [facility] coverage for [facility] is from 8 AM to 4 AM in-house and 4 PM to 8 PM by telephone/video. 8 PM to 8 AM emergent questions or overnight urgent questions should be addressed to  Teleneurology On-call or [facility] neurohospitalist; contact information can be found on AMION

## 2021-11-28 NOTE — Plan of Care (Signed)
MRI brain personally reviewed, agree with radiology negative for acute intracranial process.  Appropriate for outpatient follow-up

## 2021-11-28 NOTE — ED Notes (Signed)
Code stroke called at 3:31

## 2021-11-28 NOTE — Discharge Instructions (Addendum)
Your MRI was negative.  This looks like it was a complex migraine.  Our neurologist wanted you to follow-up with outpatient neurology.  Dr. Manuella Ghazi and Dr. Melrose Nakayama are her 2 neurologist in town.  They are quite good.  I have given you their names and phone numbers.  Please call and ask for outpatient follow-up.  Let the office know you had right-sided numbness with your migraine and you were told to follow-up with outpatient neurology.  Please return if this happens again.

## 2021-11-28 NOTE — ED Notes (Signed)
Pt in MRI.

## 2021-11-28 NOTE — Progress Notes (Signed)
Code stroke activated @ 4680 with pt already in CT.  Pt returned from [place] @ 1545, Dr. Curly Shores in attendance.  Code Stroke cancelled by Dr. Curly Shores.  Jarold Song BSN, Horticulturist, commercial

## 2021-11-28 NOTE — ED Notes (Signed)
mNIHss will be overdue because pt not in room, still in MRI.

## 2021-11-28 NOTE — ED Notes (Signed)
Pt walked to toilet with walker (which is her baseline) with no new deficits or weakness.

## 2021-11-28 NOTE — ED Notes (Signed)
Pt currently in CT at this time 

## 2021-12-09 ENCOUNTER — Ambulatory Visit
Payer: BLUE CROSS/BLUE SHIELD | Attending: Student in an Organized Health Care Education/Training Program | Admitting: Student in an Organized Health Care Education/Training Program

## 2021-12-09 ENCOUNTER — Encounter: Payer: Self-pay | Admitting: Student in an Organized Health Care Education/Training Program

## 2021-12-09 VITALS — BP 141/99 | HR 75 | Temp 97.2°F | Resp 14 | Ht 65.0 in | Wt 236.0 lb

## 2021-12-09 DIAGNOSIS — Z716 Tobacco abuse counseling: Secondary | ICD-10-CM | POA: Insufficient documentation

## 2021-12-09 DIAGNOSIS — G8929 Other chronic pain: Secondary | ICD-10-CM | POA: Insufficient documentation

## 2021-12-09 DIAGNOSIS — M5416 Radiculopathy, lumbar region: Secondary | ICD-10-CM | POA: Diagnosis present

## 2021-12-09 DIAGNOSIS — M533 Sacrococcygeal disorders, not elsewhere classified: Secondary | ICD-10-CM | POA: Diagnosis not present

## 2021-12-09 DIAGNOSIS — G5701 Lesion of sciatic nerve, right lower limb: Secondary | ICD-10-CM | POA: Diagnosis not present

## 2021-12-09 DIAGNOSIS — M47818 Spondylosis without myelopathy or radiculopathy, sacral and sacrococcygeal region: Secondary | ICD-10-CM | POA: Diagnosis not present

## 2021-12-09 NOTE — Patient Instructions (Addendum)
______________________________________________________________________  Preparing for your procedure  During your procedure appointment there will be: No Prescription Refills. No disability issues to discussed. No medication changes or discussions.  Instructions: Food intake: Avoid eating anything solid for at least 8 hours prior to your procedure. Clear liquid intake: You may take clear liquids such as water up to 2 hours prior to your procedure. (No carbonated drinks. No soda.) Transportation: Unless otherwise stated by your physician, bring a driver. Morning Medicines: Except for blood thinners, take all of your other morning medications with a sip of water. Make sure to take your heart and blood pressure medicines. If your blood pressure's lower number is above 100, the case will be rescheduled. Blood thinners: If you take a blood thinner, but were not instructed to stop it, call our office [phone] and ask to talk to a nurse. Not stopping a blood thinner prior to certain procedures could lead to serious complications. Diabetics on insulin: Notify the staff so that you can be scheduled 1st case in the morning. If your diabetes requires high dose insulin, take only  of your normal insulin dose the morning of the procedure and notify the staff that you have done so. Preventing infections: Shower with an antibacterial soap the morning of your procedure.  Build-up your immune system: Take 1000 mg of Vitamin C with every meal (3 times a day) the day prior to your procedure. Antibiotics: Inform the nursing staff if you are taking any antibiotics or if you have any conditions that may require antibiotics prior to procedures. (Example: recent joint implants)   Pregnancy: If you are pregnant make sure to notify the nursing staff. Not doing so may result in injury to the fetus, including death.  Sickness: If you have a cold, fever, or any active infections, call and cancel or reschedule your  procedure. Receiving steroids while having an infection may result in complications. Arrival: You must be in the facility at least 30 minutes prior to your scheduled procedure. Tardiness: Your scheduled time is also the cutoff time. If you do not arrive at least 15 minutes prior to your procedure, you will be rescheduled.  Children: Do not bring any children with you. Make arrangements to keep them home. Dress appropriately: There is always a possibility that your clothing may get soiled. Avoid long dresses. Valuables: Do not bring any jewelry or valuables.  Reasons to call and reschedule or cancel your procedure: (Following these recommendations will minimize the risk of a serious complication.) Surgeries: Avoid having procedures within 2 weeks of any surgery. (Avoid for 2 weeks before or after any surgery). Flu Shots: Avoid having procedures within 2 weeks of a flu shots or . (Avoid for 2 weeks before or after immunizations). Barium: Avoid having a procedure within 7-10 days after having had a radiological study involving the use of radiological contrast. (Myelograms, Barium swallow or enema study). Heart attacks: Avoid any elective procedures or surgeries for the initial 6 months after a "Myocardial Infarction" (Heart Attack). Blood thinners: It is imperative that you stop these medications before procedures. Let us know if you if you take any blood thinner.  Infection: Avoid procedures during or within two weeks of an infection (including chest colds or gastrointestinal problems). Symptoms associated with infections include: Localized redness, fever, chills, night sweats or profuse sweating, burning sensation when voiding, cough, congestion, stuffiness, runny nose, sore throat, diarrhea, nausea, vomiting, cold or Flu symptoms, recent or current infections. It is specially important if the infection is   over the area that we intend to treat. Heart and lung problems: Symptoms that may suggest an  active cardiopulmonary problem include: cough, chest pain, breathing difficulties or shortness of breath, dizziness, ankle swelling, uncontrolled high or unusually low blood pressure, and/or palpitations. If you are experiencing any of these symptoms, cancel your procedure and contact your primary care physician for an evaluation.  Remember:  Regular Business hours are:  Monday to Thursday 8:00 AM to 4:00 PM  Provider's Schedule: Francisco Naveira, MD:  Procedure days: Tuesday and Thursday 7:30 AM to 4:00 PM  Bilal Lateef, MD:  Procedure days: Monday and Wednesday 7:30 AM to 4:00 PM  ______________________________________________________________________    

## 2021-12-09 NOTE — Progress Notes (Signed)
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 m)   BMI Assessment: Estimated body mass index is 39.27 kg/m as calculated from the following:   Height as of this encounter: _1  (1.651 m).   Weight as of this encounter: 236 lb (107 kg).  BMI interpretation table: BMI level Category Range association with higher incidence of chronic pain  <18 kg/m2 Underweight   18.5-24.9 kg/m2 Ideal body weight   25-29.9 kg/m2 Overweight Increased incidence by 20%  30-34.9 kg/m2 Obese (Class I) Increased incidence by 68%  35-39.9 kg/m2 Severe obesity (Class II) Increased incidence by 136%  >40 kg/m2 Extreme obesity (Class III) Increased incidence by 254%   Patient's current BMI Ideal Body weight  Body mass index is 39.27 kg/m. Ideal  body weight: 57 kg (125 lb 10.6 oz) Adjusted ideal body weight: 77 kg (169 lb 12.8 oz)   BMI Readings from Last 4 Encounters:  12/09/21 39.27 kg/m  11/06/21 40.94 kg/m  11/02/21 41.93 kg/m  10/23/21 40.44 kg/m   Wt Readings from Last 4 Encounters:  12/09/21 236 lb (107 kg)  11/06/21 246 lb (111.6 kg)  11/02/21 252 lb (114.3 kg)  10/23/21 243 lb (110.2 kg)    Psych/Mental status: Alert, oriented x 3 (person, place, &  time)       Eyes: PERLA Respiratory: No evidence of acute respiratory distress  Lumbar Spine Area Exam  Skin & Axial Inspection: No masses, redness, or swelling Alignment: Symmetrical Functional ROM: Pain restricted ROM affecting primarily the right Stability: No instability detected Muscle Tone/Strength: Functionally intact. No obvious neuro-muscular anomalies detected. Sensory (Neurological): Dermatomal pain pattern right L4-L5 Palpation: No palpable anomalies       Provocative Tests: Hyperextension/rotation test: (+) due to pain. Lumbar quadrant test (Kemp's test): (+) on the right for foraminal stenosis Lateral bending test: (+) ipsilateral radicular pain, on the right. Positive for right-sided foraminal stenosis. Patrick's Maneuver: (+) for right-sided S-I arthralgia             FABER* test: (+) for right-sided S-I arthralgia  S-I anterior distraction/compression test: (+) for right-sided S-I arthralgia  S-I lateral compression test:(+) for right-sided S-I arthralgia  S-I Thigh-thrust test: (+) for right-sided S-I arthralgia  S-I Gaenslen's test: (+) for right-sided S-I arthralgia   Gait & Posture Assessment  Ambulation: Patient ambulates using a walker Gait: Antalgic gait (limping) Posture: Difficulty standing up straight, due to pain  Lower Extremity Exam    Side: Right lower extremity  Side: Left lower extremity  Stability: No instability observed          Stability: No instability observed          Skin & Extremity Inspection: Skin color,  temperature, and hair growth are WNL. No peripheral edema or cyanosis. No masses, redness, swelling, asymmetry, or associated skin lesions. No contractures.  Skin & Extremity Inspection: Skin color, temperature, and hair growth are WNL. No peripheral edema or cyanosis. No masses, redness, swelling, asymmetry, or associated skin lesions. No contractures.  Functional ROM: Pain restricted ROM for hip and knee joints          Functional ROM: Unrestricted ROM                  Muscle Tone/Strength: Functionally intact. No obvious neuro-muscular anomalies detected.  Muscle Tone/Strength: Functionally intact. No obvious neuro-muscular anomalies detected.  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 Diagnoses of Lumbar radiculopathy, SI joint arthritis, Sacroiliac joint pain, and Piriformis syndrome of right side were also pertinent to this visit.  Visit Diagnosis (New problems to examiner): 1. 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 Lumbar radiculopathy   3. SI joint arthritis   4. Sacroiliac joint pain   5. Piriformis syndrome of right side    Plan of Care (Initial workup plan)   1. 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  Continue multimodal analgesics including her neuromodulators: Lyrica, Cymbalta as prescribed by primary care provider. - MR LUMBAR SPINE WO CONTRAST; Future - Lumbar Epidural Injection; Future  2. Lumbar radiculopathy --Patient will be starting  physical therapy next week, encouraged her to continue with her sessions.  Continue multimodal analgesics including her neuromodulators: Lyrica, Cymbalta as prescribed by primary care provider. - MR LUMBAR SPINE WO CONTRAST; Future - Lumbar Epidural Injection; Future  3. SI joint  arthritis -Positive CT findings and physical exam findings suggestive of right SI joint arthropathy piriformis syndrome.  Discussed SI joint stretching exercises.  Consider diagnostic SI joint injection  4. Sacroiliac joint pain -Positive CT findings and physical exam findings suggestive of right SI joint arthropathy piriformis syndrome.  Discussed SI joint stretching exercises.  Consider diagnostic SI joint injection  5. Piriformis syndrome of right side -Consider diagnostic right piriformis trigger point injection under fluoroscopy  Defer medication management to primary care provider.  Do not recommend chronic opioid therapy for her condition especially given her psychiatric comorbidities which makes her high risk for opioid misuse/abuse.  Smoking cessation counseling: Pt acknowledges the risks of long term smoking, he will try to quit smoking. Options for different medications including nicotine products, chewing gum, patch etc, Wellbutrin and Chantix were discussed   Imaging Orders         MR LUMBAR SPINE WO CONTRAST      Procedure Orders         Lumbar Epidural Injection     Pharmacotherapy (current): Medications ordered:  No orders of the defined types were placed in this encounter.  Medications administered during this visit: Tameshia L. Hellmann had no medications administered during this visit.   Analgesic Pharmacotherapy:  Opioid Analgesics: For patients currently taking or requesting to take opioid analgesics, in accordance with [facility], we will assess their risks and indications for the use of these substances. After completing our evaluation, we may offer  recommendations, but we no longer take patients for medication management. The prescribing physician will ultimately decide, based on his/her training and level of comfort whether to adopt any of the recommendations, including whether or not to prescribe such medicines.  Membrane stabilizer: Adequate regimen Lyrica 75 mg twice daily, Cymbalta 30 mg daily, amitriptyline 25 mg nightly  Muscle relaxant: Adequate regimen tizanidine as needed  NSAID: I will not be taking over Ms. Mangione medication regimen  Other analgesic(s): To be determined at a later time   Interventional management options: Ms. Eppard was informed that there is no guarantee that she would be a candidate for interventional therapies. The decision will be based on the results of diagnostic studies, as well as Ms. Griggs's risk profile.  Procedure(s) under consideration:  Lumbar epidural steroid injection Right sacroiliac joint injection Right piriformis injection  Provider-requested follow-up: Return in about 2 weeks (around 12/23/2021).  Future Appointments  Date Time Provider [place]  12/14/2021  9:30 AM Daneil Dan, PT [facility] None  12/16/2021  9:30 AM Daneil Dan, PT [facility] None  12/20/2021 10:15 AM Daneil Dan, PT [facility] None  12/21/2021  1:30 PM Andrea Boot, PA-C [facility] None  12/28/2021 10:15 AM Daneil Dan, PT [facility] None  12/30/2021 11:45 AM Daneil Dan, PT [facility] None  01/04/2022  9:30 AM Daneil Dan, PT [facility] None  01/06/2022 11:00 AM Daneil Dan, PT [facility] None  01/10/2022  4:30 PM Daneil Dan, PT [facility] None  01/12/2022  9:30 AM Daneil Dan, PT [facility] None  01/17/2022  8:45 AM Daneil Dan, PT [facility] None  01/19/2022  9:30 AM Daneil Dan, PT [facility] None  01/25/2022  9:30 AM Daneil Dan, PT [facility] None  01/27/2022  9:30 AM Daneil Dan, PT [facility] None   I spent a total of 60 minutes reviewing chart  data, face-to-face evaluation with the patient, counseling and coordination of care as detailed above.   Note by:  Andrea Santa, MD Date: 12/09/2021; Time: 1:39 PM

## 2021-12-14 ENCOUNTER — Telehealth: Payer: Self-pay

## 2021-12-14 ENCOUNTER — Ambulatory Visit: Payer: 59 | Admitting: Physical Therapy

## 2021-12-14 NOTE — Telephone Encounter (Signed)
Pt did not show for scheduled evaluation today. Author called pt who reports she had the wrong day. Pt confirms she is able to make her Thursday 11/16 appointment at 9:30a. Pt will be evaluated on this date.    9:54 AM, 12/14/21 Rosamaria Lints, PT, DPT Physical Therapist - [place] [phone] (Office)

## 2021-12-16 ENCOUNTER — Telehealth: Payer: Self-pay | Admitting: Physical Therapy

## 2021-12-16 ENCOUNTER — Encounter: Payer: Commercial Managed Care - HMO | Admitting: Physical Therapy

## 2021-12-16 ENCOUNTER — Ambulatory Visit: Payer: Commercial Managed Care - HMO | Attending: Orthopedic Surgery | Admitting: Physical Therapy

## 2021-12-16 NOTE — Telephone Encounter (Signed)
Called pt inquire about absence from PT. Did not reach and VM full. Removing from schedule until pt calls to reschedule and will only be able to schedule one apt at a time per attendance policy.

## 2021-12-20 ENCOUNTER — Encounter: Payer: Commercial Managed Care - HMO | Admitting: Physical Therapy

## 2021-12-20 NOTE — Progress Notes (Deleted)
Referring Physician:  Sandrea Hughs, NP [address] [place],  [place] [address]  Primary Physician:  Sandrea Hughs, NP  History of Present Illness: [name] was last seen by me on 11/02/21 for constant right > left LBP with pain in right leg to her foot (entire leg) that started on 10/11/21 when she was making a bed. She has constant numbness in right leg from her knee to her foot.    She has known lumbar spondylosis with facet hypertrophy L4-S1 and possible right foraminal stenosis L4-L5. This could explain her LBP and possibly some of her right leg pain.   She was sent to PT for her lumbar spine (did not go yet). She was also referred to pain management to discuss injections.   Dr. Cherylann Ratel saw her on 12/09/21 and agreed with PT. He thought some of her pain may be SI joint mediated. He ordered a new lumbar MRI and has follow up with him on 12/27/21.       is here today for f/u of her ED visits. Seen on 10/11/21, 10/18/21, and 10/23/21 for back and right leg pain with right leg giving way causing her to fall.   Pain started when making a bed on 10/11/21 and feeling a catch in her right hip. Now with constant right > left LBP with pain in right leg to her foot (entire leg). She has constant numbness in right leg from her knee to her foot. She has fallen multiple times due to this numbness.   No bowel or bladder issues.   She smokes 5 cigarettes a day.   Given toradol, flexeril, norco 5, prednisone, and lidoderm patches from ED visits.   She is currently taking flexeril, norco, and lyrica. Has not picked up lidoderm patches from pharmacy yet.   She had back pain 10+ years that improved with PT.   Conservative measures:  Physical therapy: ordered at last visit but not done.  Multimodal medical therapy including regular antiinflammatories: toradol, flexeril, norco 5, prednisone, and lidoderm patches.  Injections: No epidural steroid injections  Past Surgery: no  lumbar surgery  Review of Systems:  A 10 point review of systems is negative, except for the pertinent positives and negatives detailed in the HPI.  Past Medical History: Past Medical History:  Diagnosis Date   Asthma    Bronchitis    Edema    Hypertension    Morbid obesity with BMI of 40.0-44.9, adult [facility])     Past Surgical History: Past Surgical History:  Procedure Laterality Date   ABDOMINAL HYSTERECTOMY     BACK SURGERY  05/2013   etopic pregnancy      Allergies: Allergies as of 12/21/2021   (No Known Allergies)    Medications: Outpatient Encounter Medications as of 12/21/2021  Medication Sig   albuterol (VENTOLIN HFA) 108 (90 Base) MCG/ACT inhaler Inhale 2 puffs into the lungs every 6 (six) hours as needed for wheezing or shortness of breath.   amitriptyline (ELAVIL) 25 MG tablet Take 1 tablet (25 mg total) by mouth at bedtime.   DULoxetine (CYMBALTA) 30 MG capsule duloxetine 30 mg capsule,delayed release   lisinopril (ZESTRIL) 40 MG tablet Hold until followup with PCP since your blood pressure was normal without this.   pregabalin (LYRICA) 75 MG capsule Take 1 capsule (75 mg total) by mouth 2 (two) times daily.   propranolol (INDERAL) 80 MG tablet Check with your PCP to see if you are still taking this.  QUEtiapine (SEROQUEL) 200 MG tablet Take 1 tablet (200 mg total) by mouth at bedtime.   tiZANidine (ZANAFLEX) 4 MG tablet Take 4 mg by mouth at bedtime.   No facility-administered encounter medications on file as of 12/21/2021.    Social History: Social History   Tobacco Use   Smoking status: Former    Packs/day: 0.50    Years: 36.00    Total pack years: 18.00    Types: Cigarettes    Quit date: 11/08/2021    Years since quitting: 0.1   Smokeless tobacco: Never  Vaping Use   Vaping Use: Never used  Substance Use Topics   Alcohol use: Not Currently    Alcohol/week: 2.0 standard drinks of alcohol    Types: 2 Shots of liquor per week    Comment: last use  08/2020   Drug use: No    Family Medical History: Family History  Problem Relation Age of Onset   Prostate cancer Father     Physical Examination: There were no vitals filed for this visit.    Awake, alert, oriented to person, place, and time.  Speech is clear and fluent. Fund of knowledge is appropriate.   Cranial Nerves: Pupils equal round and reactive to light.  Facial tone is symmetric.  Facial sensation is symmetric.  No abnormal lesions on exposed skin.   Strength: Side Biceps Triceps Deltoid Interossei Grip Wrist Ext. Wrist Flex.  R 5 5 5 5 5 5 5   L 5 5 5 5 5 5 5    Side Iliopsoas Quads Hamstring PF DF EHL  R 5 5 5 5 5 5   L 5 5 5 5 5 5    Reflexes are 2+ and symmetric at the biceps, triceps, brachioradialis, patella and achilles.   Hoffman's is absent.  Clonus is not present.   Bilateral upper and lower extremity sensation is intact to light touch, except sensation is decreased right leg from knee into foot.   Has pain with ROM of both hips that is diffuse in back and right knee with ROM of right hip.   Gait not tested. She is in a WC.   Medical Decision Making  Imaging: No updated imaging to review.    Assessment and Plan: [name] is a pleasant 51 y.o. female with  constant right > left LBP with pain in right leg to her foot (entire leg) that started on 10/11/21 when she was making a bed. She has constant numbness in right leg from her knee to her foot.   She has known lumbar spondylosis with facet hypertrophy L4-S1 and possible right foraminal stenosis L4-L5. This could explain her LBP and possibly some of her right leg pain.   Above treatment options discussed with patient and following plan made:   - Order for physical therapy for lumbar spine  to [facility]. - Referral to pain management for consideration of lumbar injections.  - Smoking cessation discussed and encouraged.  - Continue on current medications including prn flexeril, norco, and lyrica.  - If  no improvement in symptoms, consider MRI of lumbar spine.   I spent a total of 30 minutes in face-to-face and non-face-to-face activities related to this patient's care today.  Thank you for involving me in the care of this patient.   Geronimo Boot PA-C Dept. of Neurosurgery

## 2021-12-21 ENCOUNTER — Ambulatory Visit: Payer: Commercial Managed Care - HMO | Admitting: Orthopedic Surgery

## 2021-12-27 ENCOUNTER — Ambulatory Visit
Admission: RE | Admit: 2021-12-27 | Discharge: 2021-12-27 | Disposition: A | Discharge status: Home / Self Care | Payer: Commercial Managed Care - HMO | Source: Ambulatory Visit | Attending: Student in an Organized Health Care Education/Training Program | Admitting: Student in an Organized Health Care Education/Training Program

## 2021-12-27 ENCOUNTER — Ambulatory Visit
Payer: Self-pay | Attending: Student in an Organized Health Care Education/Training Program | Admitting: Student in an Organized Health Care Education/Training Program

## 2021-12-27 ENCOUNTER — Encounter: Payer: Self-pay | Admitting: Student in an Organized Health Care Education/Training Program

## 2021-12-27 DIAGNOSIS — G8929 Other chronic pain: Secondary | ICD-10-CM | POA: Insufficient documentation

## 2021-12-27 DIAGNOSIS — M5416 Radiculopathy, lumbar region: Secondary | ICD-10-CM | POA: Insufficient documentation

## 2021-12-27 MED ORDER — DEXAMETHASONE SODIUM PHOSPHATE 10 MG/ML IJ SOLN
10.0000 mg | Freq: Once | INTRAMUSCULAR | Status: AC
  Administered 2021-12-27: 10 mg
  Filled 2021-12-27: qty 1

## 2021-12-27 MED ORDER — DIAZEPAM 5 MG PO TABS
5.0000 mg | ORAL_TABLET | ORAL | Status: AC
  Administered 2021-12-27: 5 mg via ORAL

## 2021-12-27 MED ORDER — IOHEXOL 180 MG/ML  SOLN
10.0000 mL | Freq: Once | INTRAMUSCULAR | Status: AC
  Administered 2021-12-27: 10 mL via EPIDURAL
  Filled 2021-12-27: qty 20

## 2021-12-27 MED ORDER — DIAZEPAM 5 MG PO TABS
ORAL_TABLET | ORAL | Status: AC
  Filled 2021-12-27: qty 1

## 2021-12-27 MED ORDER — ROPIVACAINE HCL 2 MG/ML IJ SOLN
2.0000 mL | Freq: Once | INTRAMUSCULAR | Status: AC
  Administered 2021-12-27: 2 mL via EPIDURAL
  Filled 2021-12-27: qty 20

## 2021-12-27 MED ORDER — LIDOCAINE HCL 2 % IJ SOLN
20.0000 mL | Freq: Once | INTRAMUSCULAR | Status: AC
  Administered 2021-12-27: 100 mg
  Filled 2021-12-27: qty 40

## 2021-12-27 MED ORDER — SODIUM CHLORIDE 0.9% FLUSH
2.0000 mL | Freq: Once | INTRAVENOUS | Status: AC
  Administered 2021-12-27: 2 mL

## 2021-12-27 NOTE — Progress Notes (Signed)
Safety precautions to be maintained throughout the outpatient stay will include: orient to surroundings, keep bed in low position, maintain call bell within reach at all times, provide assistance with transfer out of bed and ambulation.  

## 2021-12-27 NOTE — Progress Notes (Signed)
PROVIDER NOTE: Interpretation of information contained herein should be left to medically-trained personnel. Specific patient instructions are provided elsewhere under "Patient Instructions" section of medical record. This document was created in part using STT-dictation technology, any transcriptional errors that may result from this process are unintentional.  Patient: Andrea Kim Type: Established DOB: February 25, 1970 MRN: [id number] PCP: Sandrea Hughs, NP  Service: Procedure DOS: 12/27/2021 Setting: Ambulatory Location: Ambulatory outpatient facility Delivery: Face-to-face Provider: Edward Jolly, MD Specialty: Interventional Pain Management Specialty designation: 09 Location: Outpatient facility Ref. Prov.: Edward Jolly, MD    Primary Reason for Visit: Interventional Pain Management Treatment. CC: Back Pain   Procedure:           Type: Lumbar epidural steroid injection (LESI) (interlaminar) #1    Laterality: Right   Level:  L4-5 Level.  Imaging: Fluoroscopic guidance         Anesthesia: Local anesthesia (1-2% Lidocaine) Anxiolysis: Valium 5 mg p.o. Sedation: Minimal Sedation                       DOS: 12/27/2021  Performed by: Edward Jolly, MD  Purpose: Diagnostic/Therapeutic Indications: Lumbar radicular pain of intraspinal etiology of more than 4 weeks that has failed to respond to conservative therapy and is severe enough to impact quality of life or function. 1. 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 Lumbar radiculopathy    NAS-11 Pain score:   Pre-procedure: 9 /10   Post-procedure: 9 /10      Position / Prep / Materials:  Position: Prone w/ head of the table raised (slight reverse trendelenburg) to facilitate breathing.  Prep solution: DuraPrep (Iodine Povacrylex [0.7% available iodine] and Isopropyl Alcohol, 74% w/w) Prep Area: Entire Posterior Lumbar Region from lower scapular tip down to mid buttocks area and from flank to flank. Materials:   Tray: Epidural tray Needle(s):  Type: Epidural needle (Tuohy) Gauge (G):  22 Length: Regular (3.5-in) Qty: 1  Pre-op H&P Assessment:  Andrea Kim is a 51 y.o. (year old), female patient, seen today for interventional treatment. She  has a past surgical history that includes Back surgery (05/2013); etopic pregnancy; and Abdominal hysterectomy. Andrea Kim has a current medication list which includes the following prescription(s): albuterol, duloxetine, lisinopril, propranolol, tizanidine, amitriptyline, pregabalin, and quetiapine. Her primarily concern today is the Back Pain  Initial Vital Signs:  Pulse/HCG Rate: (!) 55ECG Heart Rate: 93 Temp: (!) 97.3 F (36.3 C) Resp: 16 BP: (!) 157/83 SpO2: 98 %  BMI: Estimated body mass index is 39.27 kg/m as calculated from the following:   Height as of this encounter: 5\' 5"  (1.651 m).   Weight as of this encounter: 236 lb (107 kg).  Risk Assessment: Allergies: Reviewed. She has No Known Allergies.  Allergy Precautions: None required Coagulopathies: Reviewed. None identified.  Blood-thinner therapy: None at this time Active Infection(s): Reviewed. None identified. Andrea Kim is afebrile  Site Confirmation: Andrea Kim was asked to confirm the procedure and laterality before marking the site Procedure checklist: Completed Consent: Before the procedure and under the influence of no sedative(s), amnesic(s), or anxiolytics, the patient was informed of the treatment options, risks and possible complications. To fulfill our ethical and legal obligations, as recommended by the American Medical Association's Code of Ethics, I have informed the patient of my clinical impression; the nature and purpose of the treatment or procedure; the risks, benefits, and possible complications of the intervention; the alternatives, including doing nothing; the risk(s) and benefit(s) of the  alternative treatment(s) or procedure(s); and the risk(s) and benefit(s) of  doing nothing. The patient was provided information about the general risks and possible complications associated with the procedure. These may include, but are not limited to: failure to achieve desired goals, infection, bleeding, organ or nerve damage, allergic reactions, paralysis, and death. In addition, the patient was informed of those risks and complications associated to Spine-related procedures, such as failure to decrease pain; infection (i.e.: Meningitis, epidural or intraspinal abscess); bleeding (i.e.: epidural hematoma, subarachnoid hemorrhage, or any other type of intraspinal or peri-dural bleeding); organ or nerve damage (i.e.: Any type of peripheral nerve, nerve root, or spinal cord injury) with subsequent damage to sensory, motor, and/or autonomic systems, resulting in permanent pain, numbness, and/or weakness of one or several areas of the body; allergic reactions; (i.e.: anaphylactic reaction); and/or death. Furthermore, the patient was informed of those risks and complications associated with the medications. These include, but are not limited to: allergic reactions (i.e.: anaphylactic or anaphylactoid reaction(s)); adrenal axis suppression; blood sugar elevation that in diabetics may result in ketoacidosis or comma; water retention that in patients with history of congestive heart failure may result in shortness of breath, pulmonary edema, and decompensation with resultant heart failure; weight gain; swelling or edema; medication-induced neural toxicity; particulate matter embolism and blood vessel occlusion with resultant organ, and/or nervous system infarction; and/or aseptic necrosis of one or more joints. Finally, the patient was informed that Medicine is not an exact science; therefore, there is also the possibility of unforeseen or unpredictable risks and/or possible complications that may result in a catastrophic outcome. The patient indicated having understood very clearly. We have  given the patient no guarantees and we have made no promises. Enough time was given to the patient to ask questions, all of which were answered to the patient's satisfaction. Andrea Kim has indicated that she wanted to continue with the procedure. Attestation: I, the ordering provider, attest that I have discussed with the patient the benefits, risks, side-effects, alternatives, likelihood of achieving goals, and potential problems during recovery for the procedure that I have provided informed consent. Date  Time: 12/27/2021  8:31 AM  Pre-Procedure Preparation:  Monitoring: As per clinic protocol. Respiration, ETCO2, SpO2, BP, heart rate and rhythm monitor placed and checked for adequate function Safety Precautions: Patient was assessed for positional comfort and pressure points before starting the procedure. Time-out: I initiated and conducted the "Time-out" before starting the procedure, as per protocol. The patient was asked to participate by confirming the accuracy of the "Time Out" information. Verification of the correct person, site, and procedure were performed and confirmed by me, the nursing staff, and the patient. "Time-out" conducted as per Joint Commission's Universal Protocol (UP.01.01.01). Time: 0920  Description/Narrative of Procedure:          Target: Epidural space via interlaminar opening, initially targeting the lower laminar border of the superior vertebral body. Region: Lumbar Approach: Percutaneous paravertebral  Rationale (medical necessity): procedure needed and proper for the diagnosis and/or treatment of the patient's medical symptoms and needs. Procedural Technique Safety Precautions: Aspiration looking for blood return was conducted prior to all injections. At no point did we inject any substances, as a needle was being advanced. No attempts were made at seeking any paresthesias. Safe injection practices and needle disposal techniques used. Medications properly  checked for expiration dates. SDV (single dose vial) medications used. Description of the Procedure: Protocol guidelines were followed. The procedure needle was introduced through the skin, ipsilateral to the  reported pain, and advanced to the target area. Bone was contacted and the needle walked caudad, until the lamina was cleared. The epidural space was identified using "loss-of-resistance technique" with 2-3 ml of PF-NaCl (0.9% NSS), in a 5cc LOR glass syringe.  5 cc solution made of 2 cc of preservative-free saline, 2 cc of 0.2% ropivacaine, 1 cc of Decadron 10 mg/cc.   Vitals:   12/27/21 0842 12/27/21 0915 12/27/21 0920 12/27/21 0924  BP: (!) 157/83 (!) 177/91 (!) 168/95 132/86  Pulse: (!) 55     Resp: 16 18 16 18   Temp: (!) 97.3 F (36.3 C)     SpO2: 98% 97% 95% 95%  Weight: 236 lb (107 kg)     Height: 5\' 5"  (1.651 m)       Start Time: 0920 hrs. End Time: 0924 hrs.  Imaging Guidance (Spinal):          Type of Imaging Technique: Fluoroscopy Guidance (Spinal) Indication(s): Assistance in needle guidance and placement for procedures requiring needle placement in or near specific anatomical locations not easily accessible without such assistance. Exposure Time: Please see nurses notes. Contrast: Before injecting any contrast, we confirmed that the patient did not have an allergy to iodine, shellfish, or radiological contrast. Once satisfactory needle placement was completed at the desired level, radiological contrast was injected. Contrast injected under live fluoroscopy. No contrast complications. See chart for type and volume of contrast used. Fluoroscopic Guidance: I was personally present during the use of fluoroscopy. "Tunnel Vision Technique" used to obtain the best possible view of the target area. Parallax error corrected before commencing the procedure. "Direction-depth-direction" technique used to introduce the needle under continuous pulsed fluoroscopy. Once target was  reached, antero-posterior, oblique, and lateral fluoroscopic projection used confirm needle placement in all planes. Images permanently stored in EMR. Interpretation: I personally interpreted the imaging intraoperatively. Adequate needle placement confirmed in multiple planes. Appropriate spread of contrast into desired area was observed. No evidence of afferent or efferent intravascular uptake. No intrathecal or subarachnoid spread observed. Permanent images saved into the patient's record.  Antibiotic Prophylaxis:   Anti-infectives (From admission, onward)    None      Indication(s): None identified  Post-operative Assessment:  Post-procedure Vital Signs:  Pulse/HCG Rate: (!) 5592 Temp: (!) 97.3 F (36.3 C) Resp: 18 BP: 132/86 SpO2: 95 %  EBL: None  Complications: No immediate post-treatment complications observed by team, or reported by patient.  Note: The patient tolerated the entire procedure well. A repeat set of vitals were taken after the procedure and the patient was kept under observation following institutional policy, for this type of procedure. Post-procedural neurological assessment was performed, showing return to baseline, prior to discharge. The patient was provided with post-procedure discharge instructions, including a section on how to identify potential problems. Should any problems arise concerning this procedure, the patient was given instructions to immediately contact , at any time, without hesitation. In any case, we plan to contact the patient by telephone for a follow-up status report regarding this interventional procedure.  Comments:  No additional relevant information.  Plan of Care  Orders:  Orders Placed This Encounter  Procedures   DG PAIN CLINIC C-ARM 1-60 MIN NO REPORT    Intraoperative interpretation by procedural physician at [facility].    Standing Status:   Standing    Number of Occurrences:   1    Order Specific Question:    Reason for exam:    Answer:   Assistance  in needle guidance and placement for procedures requiring needle placement in or near specific anatomical locations not easily accessible without such assistance.    Medications ordered for procedure: Meds ordered this encounter  Medications   iohexol (OMNIPAQUE) 180 MG/ML injection 10 mL    Must be Myelogram-compatible. If not available, you may substitute with a water-soluble, non-ionic, hypoallergenic, myelogram-compatible radiological contrast medium.   lidocaine (XYLOCAINE) 2 % (with pres) injection 400 mg   diazepam (VALIUM) tablet 5 mg    Make sure Flumazenil is available in the pyxis when using this medication. If oversedation occurs, administer 0.2 mg IV over 15 sec. If after 45 sec no response, administer 0.2 mg again over 1 min; may repeat at 1 min intervals; not to exceed 4 doses (1 mg)   dexamethasone (DECADRON) injection 10 mg   sodium chloride flush (NS) 0.9 % injection 2 mL   ropivacaine (PF) 2 mg/mL (0.2%) (NAROPIN) injection 2 mL   Medications administered: We administered iohexol, lidocaine, diazepam, dexamethasone, sodium chloride flush, and ropivacaine (PF) 2 mg/mL (0.2%).  See the medical record for exact dosing, route, and time of administration.  Follow-up plan:   Return in about 4 weeks (around 01/24/2022) for Post Procedure Evaluation, in person.        Right L4-L5 ESI    Recent Visits Date Type Provider Dept  12/09/21 Office Visit Edward JollyLateef, Hideko Esselman, MD [facility]  Showing recent visits within past 90 days and meeting all other requirements Today's Visits Date Type Provider Dept  12/27/21 Procedure visit Edward JollyLateef, Sandip Power, MD [facility]  Showing today's visits and meeting all other requirements Future Appointments No visits were found meeting these conditions. Showing future appointments within next 90 days and meeting all other requirements  Disposition: Discharge home  Discharge (Date  Time):  12/27/2021; 0937 (denies numbness in legs or headaches. Dr Cherylann RatelLateef aware. DC orders received,) hrs.   Primary Care Physician: Sandrea Hughsubio, Jessica, NP [facility] Note by: Edward JollyBilal Jeanett Antonopoulos, MD Date: 12/27/2021; Time: 9:38 AM  Disclaimer:  Medicine is not an exact science. The only guarantee in medicine is that nothing is guaranteed. It is important to note that the decision to proceed with this intervention was based on the information collected from the patient. The Data and conclusions were drawn from the patient's questionnaire, the interview, and the physical examination. Because the information was provided in large part by the patient, it cannot be guaranteed that it has not been purposely or unconsciously manipulated. Every effort has been made to obtain as much relevant data as possible for this evaluation. It is important to note that the conclusions that lead to this procedure are derived in large part from the available data. Always take into account that the treatment will also be dependent on availability of resources and existing treatment guidelines, considered by other Pain Management Practitioners as being common knowledge and practice, at the time of the intervention. For Medico-Legal purposes, it is also important to point out that variation in procedural techniques and pharmacological choices are the acceptable norm. The indications, contraindications, technique, and results of the above procedure should only be interpreted and judged by a Board-Certified Interventional Pain Specialist with extensive familiarity and expertise in the same exact procedure and technique.

## 2021-12-27 NOTE — Patient Instructions (Signed)
____________________________________________________________________________________________  Post-Procedure Discharge Instructions  Instructions: Apply ice:  Purpose: This will minimize any swelling and discomfort after procedure.  When: Day of procedure, as soon as you get home. How: Fill a plastic sandwich bag with crushed ice. Cover it with a small towel and apply to injection site. How long: (15 min on, 15 min off) Apply for 15 minutes then remove x 15 minutes.  Repeat sequence on day of procedure, until you go to bed. Apply heat:  Purpose: To treat any soreness and discomfort from the procedure. When: Starting the next day after the procedure. How: Apply heat to procedure site starting the day following the procedure. How long: May continue to repeat daily, until discomfort goes away. Food intake: Start with clear liquids (like water) and advance to regular food, as tolerated.  Physical activities: Keep activities to a minimum for the first 8 hours after the procedure. After that, then as tolerated. Driving: If you have received any sedation, be responsible and do not drive. You are not allowed to drive for 24 hours after having sedation. Blood thinner: (Applies only to those taking blood thinners) You may restart your blood thinner 6 hours after your procedure. Insulin: (Applies only to Diabetic patients taking insulin) As soon as you can eat, you may resume your normal dosing schedule. Infection prevention: Keep procedure site clean and dry. Shower daily and clean area with soap and water. Post-procedure Pain Diary: Extremely important that this be done correctly and accurately. Recorded information will be used to determine the next step in treatment. For the purpose of accuracy, follow these rules: Evaluate only the area treated. Do not report or include pain from an untreated area. For the purpose of this evaluation, ignore all other areas of pain, except for the treated  area. After your procedure, avoid taking a long nap and attempting to complete the pain diary after you wake up. Instead, set your alarm clock to go off every hour, on the hour, for the initial 8 hours after the procedure. Document the duration of the numbing medicine, and the relief you are getting from it. Do not go to sleep and attempt to complete it later. It will not be accurate. If you received sedation, it is likely that you were given a medication that may cause amnesia. Because of this, completing the diary at a later time may cause the information to be inaccurate. This information is needed to plan your care. Follow-up appointment: Keep your post-procedure follow-up evaluation appointment after the procedure (usually 2 weeks for most procedures, 6 weeks for radiofrequencies). DO NOT FORGET to bring you pain diary with you.   Expect: (What should I expect to see with my procedure?) From numbing medicine (AKA: Local Anesthetics): Numbness or decrease in pain. You may also experience some weakness, which if present, could last for the duration of the local anesthetic. Onset: Full effect within 15 minutes of injected. Duration: It will depend on the type of local anesthetic used. On the average, 1 to 8 hours.  From steroids (Applies only if steroids were used): Decrease in swelling or inflammation. Once inflammation is improved, relief of the pain will follow. Onset of benefits: Depends on the amount of swelling present. The more swelling, the longer it will take for the benefits to be seen. In some cases, up to 10 days. Duration: Steroids will stay in the system x 2 weeks. Duration of benefits will depend on multiple posibilities including persistent irritating factors. Side-effects: If present, they   may typically last 2 weeks (the duration of the steroids). Frequent: Cramps (if they occur, drink Gatorade and take over-the-counter Magnesium 450-500 mg once to twice a day); water retention with  temporary weight gain; increases in blood sugar; decreased immune system response; increased appetite. Occasional: Facial flushing (red, warm cheeks); mood swings; menstrual changes. Uncommon: Long-term decrease or suppression of natural hormones; bone thinning. (These are more common with higher doses or more frequent use. This is why we prefer that our patients avoid having any injection therapies in other practices.)  Very Rare: Severe mood changes; psychosis; aseptic necrosis. From procedure: Some discomfort is to be expected once the numbing medicine wears off. This should be minimal if ice and heat are applied as instructed.  Call if: (When should I call?) You experience numbness and weakness that gets worse with time, as opposed to wearing off. New onset bowel or bladder incontinence. (Applies only to procedures done in the spine)  Emergency Numbers: Durning business hours (Monday - Thursday, 8:00 AM - 4:00 PM) (Friday, 9:00 AM - 12:00 Noon): [phone] After hours: [phone] NOTE: If you are having a problem and are unable connect with, or to talk to a provider, then go to your nearest urgent care or emergency department. If the problem is serious and urgent, please call 911. ____________________________________________________________________________________________   Epidural Steroid Injection Patient Information  Description: The epidural space surrounds the nerves as they exit the spinal cord.  In some patients, the nerves can be compressed and inflamed by a bulging disc or a tight spinal canal (spinal stenosis).  By injecting steroids into the epidural space, we can bring irritated nerves into direct contact with a potentially helpful medication.  These steroids act directly on the irritated nerves and can reduce swelling and inflammation which often leads to decreased pain.  Epidural steroids may be injected anywhere along the spine and from the neck to the low back  depending upon the location of your pain.   After numbing the skin with local anesthetic (like Novocaine), a small needle is passed into the epidural space slowly.  You may experience a sensation of pressure while this is being done.  The entire block usually last less than 10 minutes.  Conditions which may be treated by epidural steroids:  Low back and leg pain Neck and arm pain Spinal stenosis Post-laminectomy syndrome Herpes zoster (shingles) pain Pain from compression fractures  Preparation for the injection:  Do not eat any solid food or dairy products within 8 hours of your appointment.  You may drink clear liquids up to 3 hours before appointment.  Clear liquids include water, black coffee, juice or soda.  No milk or cream please. You may take your regular medication, including pain medications, with a sip of water before your appointment  Diabetics should hold regular insulin (if taken separately) and take 1/2 normal NPH dos the morning of the procedure.  Carry some sugar containing items with you to your appointment. A driver must accompany you and be prepared to drive you home after your procedure.  Bring all your current medications with your. An IV may be inserted and sedation may be given at the discretion of the physician.   A blood pressure cuff, EKG and other monitors will often be applied during the procedure.  Some patients may need to have extra oxygen administered for a short period. You will be asked to provide medical information, including your allergies, prior to the procedure.  We must know   immediately if you are taking blood thinners (like Coumadin/Warfarin)  Or if you are allergic to IV iodine contrast (dye). We must know if you could possible be pregnant.  Possible side-effects: Bleeding from needle site Infection (rare, may require surgery) Nerve injury (rare) Numbness & tingling (temporary) Difficulty urinating (rare, temporary) Spinal headache ( a headache  worse with upright posture) Light -headedness (temporary) Pain at injection site (several days) Decreased blood pressure (temporary) Weakness in arm/leg (temporary) Pressure sensation in back/neck (temporary)  Call if you experience: Fever/chills associated with headache or increased back/neck pain. Headache worsened by an upright position. New onset weakness or numbness of an extremity below the injection site Hives or difficulty breathing (go to the emergency room) Inflammation or drainage at the infection site Severe back/neck pain Any new symptoms which are concerning to you  Please note:  Although the local anesthetic injected can often make your back or neck feel good for several hours after the injection, the pain will likely return.  It takes 3-7 days for steroids to work in the epidural space.  You may not notice any pain relief for at least that one week.  If effective, we will often do a series of three injections spaced 3-6 weeks apart to maximally decrease your pain.  After the initial series, we generally will wait several months before considering a repeat injection of the same type.  If you have any questions, please call [phone] [facility] 

## 2021-12-28 ENCOUNTER — Encounter: Payer: Commercial Managed Care - HMO | Admitting: Physical Therapy

## 2021-12-28 ENCOUNTER — Telehealth: Payer: Self-pay

## 2021-12-28 NOTE — Telephone Encounter (Signed)
Post procedure phone call.  LM 

## 2021-12-30 ENCOUNTER — Encounter: Payer: Commercial Managed Care - HMO | Admitting: Physical Therapy

## 2022-01-04 ENCOUNTER — Encounter: Payer: Commercial Managed Care - HMO | Admitting: Physical Therapy

## 2022-01-06 ENCOUNTER — Encounter: Payer: Commercial Managed Care - HMO | Admitting: Physical Therapy

## 2022-01-10 ENCOUNTER — Encounter: Payer: Commercial Managed Care - HMO | Admitting: Physical Therapy

## 2022-01-12 ENCOUNTER — Encounter: Payer: Commercial Managed Care - HMO | Admitting: Physical Therapy

## 2022-01-13 ENCOUNTER — Ambulatory Visit: Payer: Commercial Managed Care - HMO | Admitting: Physical Therapy

## 2022-01-17 ENCOUNTER — Encounter: Payer: Commercial Managed Care - HMO | Admitting: Physical Therapy

## 2022-01-19 ENCOUNTER — Encounter: Payer: Commercial Managed Care - HMO | Admitting: Physical Therapy

## 2022-01-20 ENCOUNTER — Ambulatory Visit: Payer: Commercial Managed Care - HMO | Admitting: Student in an Organized Health Care Education/Training Program

## 2022-01-25 ENCOUNTER — Encounter: Payer: Commercial Managed Care - HMO | Admitting: Physical Therapy

## 2022-01-27 ENCOUNTER — Encounter: Payer: Commercial Managed Care - HMO | Admitting: Physical Therapy

## 2022-02-07 ENCOUNTER — Ambulatory Visit: Payer: Commercial Managed Care - HMO | Admitting: Student in an Organized Health Care Education/Training Program
